# Patient Record
Sex: Male | Born: 1965 | Race: Black or African American | Hispanic: No | Marital: Single | Smoking: Never smoker
Health system: Southern US, Community
[De-identification: ages and names within clinical notes are randomized; demographics above are authoritative.]

## PROBLEM LIST (undated history)

## (undated) DIAGNOSIS — K219 Gastro-esophageal reflux disease without esophagitis: Secondary | ICD-10-CM

---

## 2001-10-29 ENCOUNTER — Emergency Department (HOSPITAL_COMMUNITY): Admission: EM | Admit: 2001-10-29 | Discharge: 2001-10-30 | Payer: Self-pay | Admitting: Emergency Medicine

## 2005-03-14 ENCOUNTER — Emergency Department (HOSPITAL_COMMUNITY): Admission: EM | Admit: 2005-03-14 | Discharge: 2005-03-14 | Payer: Self-pay | Admitting: Family Medicine

## 2012-09-08 ENCOUNTER — Emergency Department (HOSPITAL_COMMUNITY)
Admission: EM | Admit: 2012-09-08 | Discharge: 2012-09-08 | Disposition: A | Discharge status: Home / Self Care | Payer: BC Managed Care – PPO | Attending: Emergency Medicine | Admitting: Emergency Medicine

## 2012-09-08 ENCOUNTER — Encounter (HOSPITAL_COMMUNITY): Payer: Self-pay | Admitting: Emergency Medicine

## 2012-09-08 DIAGNOSIS — Z79899 Other long term (current) drug therapy: Secondary | ICD-10-CM | POA: Insufficient documentation

## 2012-09-08 DIAGNOSIS — K219 Gastro-esophageal reflux disease without esophagitis: Secondary | ICD-10-CM | POA: Insufficient documentation

## 2012-09-08 LAB — COMPREHENSIVE METABOLIC PANEL
ALT: 19 U/L (ref 0–53)
AST: 19 U/L (ref 0–37)
Albumin: 3.6 g/dL (ref 3.5–5.2)
Alkaline Phosphatase: 71 U/L (ref 39–117)
BUN: 16 mg/dL (ref 6–23)
CO2: 29 mEq/L (ref 19–32)
Calcium: 8.6 mg/dL (ref 8.4–10.5)
Chloride: 104 mEq/L (ref 96–112)
Creatinine, Ser: 1.19 mg/dL (ref 0.50–1.35)
GFR calc Af Amer: 84 mL/min — ABNORMAL LOW (ref >90)
GFR calc non Af Amer: 72 mL/min — ABNORMAL LOW (ref >90)
Glucose, Bld: 123 mg/dL — ABNORMAL HIGH (ref 70–99)
Potassium: 3.8 mEq/L (ref 3.5–5.1)
Sodium: 138 mEq/L (ref 135–145)
Total Bilirubin: 0.3 mg/dL (ref 0.3–1.2)
Total Protein: 6.8 g/dL (ref 6.0–8.3)

## 2012-09-08 LAB — CBC WITH DIFFERENTIAL/PLATELET
Basophils Absolute: 0 10*3/uL (ref 0.0–0.1)
Basophils Relative: 0 % (ref 0–1)
Eosinophils Absolute: 0.1 10*3/uL (ref 0.0–0.7)
Eosinophils Relative: 2 % (ref 0–5)
HCT: 42.2 % (ref 39.0–52.0)
Hemoglobin: 14.4 g/dL (ref 13.0–17.0)
Lymphocytes Relative: 19 % (ref 12–46)
Lymphs Abs: 1 10*3/uL (ref 0.7–4.0)
MCH: 31.4 pg (ref 26.0–34.0)
MCHC: 34.1 g/dL (ref 30.0–36.0)
MCV: 91.9 fL (ref 78.0–100.0)
Monocytes Absolute: 0.3 10*3/uL (ref 0.1–1.0)
Monocytes Relative: 6 % (ref 3–12)
Neutro Abs: 3.8 10*3/uL (ref 1.7–7.7)
Neutrophils Relative %: 73 % (ref 43–77)
Platelets: 211 10*3/uL (ref 150–400)
RBC: 4.59 MIL/uL (ref 4.22–5.81)
RDW: 12.9 % (ref 11.5–15.5)
WBC: 5.3 10*3/uL (ref 4.0–10.5)

## 2012-09-08 LAB — URINALYSIS, ROUTINE W REFLEX MICROSCOPIC
Bilirubin Urine: NEGATIVE
Glucose, UA: NEGATIVE mg/dL
Hgb urine dipstick: NEGATIVE
Ketones, ur: NEGATIVE mg/dL
Leukocytes, UA: NEGATIVE
Nitrite: NEGATIVE
Protein, ur: NEGATIVE mg/dL
Specific Gravity, Urine: 1.027 (ref 1.005–1.030)
Urobilinogen, UA: 0.2 mg/dL (ref 0.0–1.0)
pH: 6 (ref 5.0–8.0)

## 2012-09-08 LAB — OCCULT BLOOD, POC DEVICE: Fecal Occult Bld: NEGATIVE

## 2012-09-08 MED ORDER — OMEPRAZOLE 20 MG PO CPDR: 20.0000 mg | DELAYED_RELEASE_CAPSULE | Freq: Every day | ORAL | Status: DC

## 2012-09-08 MED ORDER — SUCRALFATE 1 G PO TABS
1.0000 g | ORAL_TABLET | Freq: Once | ORAL | Status: AC
  Administered 2012-09-08: 1 g via ORAL
  Filled 2012-09-08: qty 1

## 2012-09-08 MED ORDER — ONDANSETRON 4 MG PO TBDP
4.0000 mg | ORAL_TABLET | Freq: Once | ORAL | Status: AC
  Administered 2012-09-08: 4 mg via ORAL
  Filled 2012-09-08: qty 1

## 2012-09-08 MED ORDER — SUCRALFATE 1 G PO TABS: 1.0000 g | ORAL_TABLET | ORAL | Status: DC

## 2012-09-08 MED ORDER — ALUM & MAG HYDROXIDE-SIMETH 200-200-20 MG/5ML PO SUSP
30.0000 mL | Freq: Once | ORAL | Status: AC
  Administered 2012-09-08: 30 mL via ORAL
  Filled 2012-09-08: qty 30

## 2012-09-08 NOTE — ED Notes (Signed)
Pt reports 5/10 generalized abdominal pain that began around 1500. Pt reports similar episode last Sunday, which he reports was slightly relieved by pepto. Pt denies N/V/D. Pt is A/O x4 and in NAD.

## 2012-09-08 NOTE — ED Provider Notes (Signed)
CSN: [id number]     Arrival date & time 09/08/12  4098 History   First MD Initiated Contact with Patient 09/08/12 1915     Chief Complaint  Patient presents with  . Abdominal Pain    HPI Pt reports 5/10 generalized abdominal pain that began around 1500. Pt reports similar episode last Sunday, which he reports was slightly relieved by pepto. Pt denies N/V/D. Pt is A/O x4 and in NAD.  Patient states the pain is decreased but a bit now.  Patient did have some dark stool a few days ago.  Patient does have some history of reflux disease.  Denies chest pain.  Denies NSAID use. Denies alcohol use. History reviewed. No pertinent past medical history. History reviewed. No pertinent past surgical history. No family history on file. History  Substance Use Topics  . Smoking status: Never Smoker   . Smokeless tobacco: Never Used  . Alcohol Use: No    Review of Systems All other systems reviewed and are negative Allergies  Review of patient's allergies indicates no known allergies.  Home Medications   Current Outpatient Rx  Name  Route  Sig  Dispense  Refill  . bismuth subsalicylate (PEPTO BISMOL) 262 MG/15ML suspension   Oral   Take 15 mLs by mouth every 6 (six) hours as needed for indigestion.         Marland Kitchen omeprazole (PRILOSEC) 20 MG capsule   Oral   Take 1 capsule (20 mg total) by mouth daily.   14 capsule   0   . sucralfate (CARAFATE) 1 G tablet   Oral   Take 1 tablet (1 g total) by mouth 1 day or 1 dose.   14 tablet   0    BP 141/79  Pulse 74  Temp(Src) 98.6 F (37 C) (Oral)  Resp 17  SpO2 100% Physical Exam  Nursing note and vitals reviewed. Constitutional: He is oriented to person, place, and time. He appears well-developed and well-nourished. No distress.  HENT:  Head: Normocephalic and atraumatic.  Eyes: Pupils are equal, round, and reactive to light.  Neck: Normal range of motion.  Cardiovascular: Normal rate and intact distal pulses.   Pulmonary/Chest: No  respiratory distress.  Abdominal: Soft. Normal appearance and bowel sounds are normal. He exhibits no distension. There is no tenderness. There is no rebound and no guarding.  Musculoskeletal: Normal range of motion.  Neurological: He is alert and oriented to person, place, and time. No cranial nerve deficit.  Skin: Skin is warm and dry. No rash noted.  Psychiatric: He has a normal mood and affect. His behavior is normal.    ED Course  Procedures (including critical care time) Medications  alum & mag hydroxide-simeth (MAALOX/MYLANTA) 200-200-20 MG/5ML suspension 30 mL (30 mLs Oral Given 09/08/12 2058)  sucralfate (CARAFATE) tablet 1 g (1 g Oral Given 09/08/12 2231)  ondansetron (ZOFRAN-ODT) disintegrating tablet 4 mg (4 mg Oral Given 09/08/12 2230)    Labs Review Labs Reviewed  COMPREHENSIVE METABOLIC PANEL - Abnormal; Notable for the following:    Glucose, Bld 123 (*)    GFR calc non Af Amer 72 (*)    GFR calc Af Amer 84 (*)    All other components within normal limits  CBC WITH DIFFERENTIAL  URINALYSIS, ROUTINE W REFLEX MICROSCOPIC  LIPASE, BLOOD  OCCULT BLOOD, POC DEVICE   Imaging Review No results found.  MDM   1. GERD (gastroesophageal reflux disease)        Nelia Shi, MD  09/10/12 0734 

## 2012-10-26 ENCOUNTER — Ambulatory Visit (INDEPENDENT_AMBULATORY_CARE_PROVIDER_SITE_OTHER): Payer: BC Managed Care – PPO | Admitting: Family Medicine

## 2012-10-26 VITALS — BP 122/70 | HR 90 | Temp 98.0°F | Resp 16 | Ht 76.0 in | Wt 294.0 lb

## 2012-10-26 DIAGNOSIS — K219 Gastro-esophageal reflux disease without esophagitis: Secondary | ICD-10-CM

## 2012-10-26 MED ORDER — OMEPRAZOLE 20 MG PO CPDR: 20.0000 mg | DELAYED_RELEASE_CAPSULE | Freq: Every day | ORAL | Status: DC

## 2012-10-26 NOTE — Patient Instructions (Signed)
Gastroesophageal Reflux Disease, Adult  Gastroesophageal reflux disease (GERD) happens when acid from your stomach flows up into the esophagus. When acid comes in contact with the esophagus, the acid causes soreness (inflammation) in the esophagus. Over time, GERD may create small holes (ulcers) in the lining of the esophagus.  CAUSES   · Increased body weight. This puts pressure on the stomach, making acid rise from the stomach into the esophagus.  · Smoking. This increases acid production in the stomach.  · Drinking alcohol. This causes decreased pressure in the lower esophageal sphincter (valve or ring of muscle between the esophagus and stomach), allowing acid from the stomach into the esophagus.  · Late evening meals and a full stomach. This increases pressure and acid production in the stomach.  · A malformed lower esophageal sphincter.  Sometimes, no cause is found.  SYMPTOMS   · Burning pain in the lower part of the mid-chest behind the breastbone and in the mid-stomach area. This may occur twice a week or more often.  · Trouble swallowing.  · Sore throat.  · Dry cough.  · Asthma-like symptoms including chest tightness, shortness of breath, or wheezing.  DIAGNOSIS   Your caregiver may be able to diagnose GERD based on your symptoms. In some cases, X-rays and other tests may be done to check for complications or to check the condition of your stomach and esophagus.  TREATMENT   Your caregiver may recommend over-the-counter or prescription medicines to help decrease acid production. Ask your caregiver before starting or adding any new medicines.   HOME CARE INSTRUCTIONS   · Change the factors that you can control. Ask your caregiver for guidance concerning weight loss, quitting smoking, and alcohol consumption.  · Avoid foods and drinks that make your symptoms worse, such as:  · Caffeine or alcoholic drinks.  · Chocolate.  · Peppermint or mint flavorings.  · Garlic and onions.  · Spicy foods.  · Citrus fruits,  such as oranges, lemons, or limes.  · Tomato-based foods such as sauce, chili, salsa, and pizza.  · Fried and fatty foods.  · Avoid lying down for the 3 hours prior to your bedtime or prior to taking a nap.  · Eat small, frequent meals instead of large meals.  · Wear loose-fitting clothing. Do not wear anything tight around your waist that causes pressure on your stomach.  · Raise the head of your bed 6 to 8 inches with wood blocks to help you sleep. Extra pillows will not help.  · Only take over-the-counter or prescription medicines for pain, discomfort, or fever as directed by your caregiver.  · Do not take aspirin, ibuprofen, or other nonsteroidal anti-inflammatory drugs (NSAIDs).  SEEK IMMEDIATE MEDICAL CARE IF:   · You have pain in your arms, neck, jaw, teeth, or back.  · Your pain increases or changes in intensity or duration.  · You develop nausea, vomiting, or sweating (diaphoresis).  · You develop shortness of breath, or you faint.  · Your vomit is green, yellow, black, or looks like coffee grounds or blood.  · Your stool is red, bloody, or black.  These symptoms could be signs of other problems, such as heart disease, gastric bleeding, or esophageal bleeding.  MAKE SURE YOU:   · Understand these instructions.  · Will watch your condition.  · Will get help right away if you are not doing well or get worse.  Document Released: 10/05/2004 Document Revised: 03/20/2011 Document Reviewed: 07/15/2010  [employer]® Patient   Information ©2014 [employer].

## 2012-10-26 NOTE — Progress Notes (Signed)
  Subjective:    Patient ID: Logan Calderon, male    DOB: 06/07/1965, 47 y.o.   MRN: [id number]  Abdominal Pain Pertinent negatives include no constipation, diarrhea, fever, frequency, hematuria, nausea or vomiting.   This 47 y.o. male presents for evaluation of upset stomach.  Feeling better now.  Called into work due to upset stomach; requiring office note.  No fever/chill/sweats.  Gassy; +GERD at night; +stomach pains.  No n/v/d/c.  No bloody stools or black stools.  History of issues/upset stomach a few months ago.  ED visit; ED prescribed Prilosec for two weeks.  Ate pizza, soda which is trigger.  Took GasX.  No urinary symptoms.  PCP:  Sans   Review of Systems  Constitutional: Negative for fever, chills, diaphoresis and fatigue.  Gastrointestinal: Positive for abdominal pain. Negative for nausea, vomiting, diarrhea, constipation, blood in stool, abdominal distention and rectal pain.  Genitourinary: Negative for urgency, frequency, hematuria and flank pain.   History reviewed. No pertinent past medical history. History reviewed. No pertinent past surgical history. No Known Allergies Current Outpatient Prescriptions on File Prior to Visit  Medication Sig Dispense Refill  . bismuth subsalicylate (PEPTO BISMOL) 262 MG/15ML suspension Take 15 mLs by mouth every 6 (six) hours as needed for indigestion.      . sucralfate (CARAFATE) 1 G tablet Take 1 tablet (1 g total) by mouth 1 day or 1 dose.  14 tablet  0   No current facility-administered medications on file prior to visit.   Family History  Problem Relation Age of Onset  . Diabetes Father   . Diabetes Sister       Objective:   Physical Exam  Nursing note and vitals reviewed. Constitutional: He is oriented to person, place, and time. He appears well-developed and well-nourished.  HENT:  Head: Normocephalic and atraumatic.  Mouth/Throat: Oropharynx is clear and moist.  Eyes: Conjunctivae are normal. Pupils are equal, round, and  reactive to light.  Neck: Normal range of motion. Neck supple. No thyromegaly present.  Cardiovascular: Normal rate, regular rhythm and normal heart sounds.  Exam reveals no gallop and no friction rub.   No murmur heard. Pulmonary/Chest: Effort normal and breath sounds normal. He has no wheezes. He has no rales.  Abdominal: Soft. Bowel sounds are normal. He exhibits no distension and no mass. There is no tenderness. There is no rebound and no guarding.  Lymphadenopathy:    He has no cervical adenopathy.  Neurological: He is alert and oriented to person, place, and time.  Skin: Skin is warm and dry. No rash noted.  Psychiatric: He has a normal mood and affect. His behavior is normal.       Assessment & Plan:  GERD (gastroesophageal reflux disease) - Plan: omeprazole (PRILOSEC) 20 MG capsule  1. GERD:  New.  Dietary modification advised and reviewed; rx for Omeprazole provided.  Recommend weight loss and avoiding late night eating.  Meds ordered this encounter  Medications  . omeprazole (PRILOSEC) 20 MG capsule    Sig: Take 1 capsule (20 mg total) by mouth daily.    Dispense:  30 capsule    Refill:  2

## 2012-11-30 ENCOUNTER — Emergency Department (HOSPITAL_COMMUNITY)
Admission: EM | Admit: 2012-11-30 | Discharge: 2012-11-30 | Disposition: A | Discharge status: Home / Self Care | Payer: BC Managed Care – PPO | Attending: Emergency Medicine | Admitting: Emergency Medicine

## 2012-11-30 ENCOUNTER — Encounter (HOSPITAL_COMMUNITY): Payer: Self-pay | Admitting: Emergency Medicine

## 2012-11-30 DIAGNOSIS — K2970 Gastritis, unspecified, without bleeding: Secondary | ICD-10-CM | POA: Insufficient documentation

## 2012-11-30 HISTORY — DX: Gastro-esophageal reflux disease without esophagitis: K21.9

## 2012-11-30 LAB — COMPREHENSIVE METABOLIC PANEL
Albumin: 3.9 g/dL (ref 3.5–5.2)
Alkaline Phosphatase: 90 U/L (ref 39–117)
BUN: 18 mg/dL (ref 6–23)
CO2: 25 mEq/L (ref 19–32)
Chloride: 103 mEq/L (ref 96–112)
Creatinine, Ser: 1.27 mg/dL (ref 0.50–1.35)
GFR calc non Af Amer: 67 mL/min — ABNORMAL LOW (ref >90)
Glucose, Bld: 135 mg/dL — ABNORMAL HIGH (ref 70–99)
Potassium: 3.6 mEq/L (ref 3.5–5.1)
Total Bilirubin: 0.3 mg/dL (ref 0.3–1.2)

## 2012-11-30 LAB — LIPASE, BLOOD: Lipase: 51 U/L (ref 11–59)

## 2012-11-30 LAB — CBC
HCT: 43.4 % (ref 39.0–52.0)
Hemoglobin: 14.9 g/dL (ref 13.0–17.0)
MCV: 91.6 fL (ref 78.0–100.0)
RBC: 4.74 MIL/uL (ref 4.22–5.81)
WBC: 8 10*3/uL (ref 4.0–10.5)

## 2012-11-30 MED ORDER — SODIUM CHLORIDE 0.9 % IV BOLUS (SEPSIS)
1000.0000 mL | Freq: Once | INTRAVENOUS | Status: AC
  Administered 2012-11-30: 1000 mL via INTRAVENOUS

## 2012-11-30 MED ORDER — PANTOPRAZOLE SODIUM 40 MG PO TBEC: 40.0000 mg | DELAYED_RELEASE_TABLET | Freq: Every day | ORAL | Status: DC

## 2012-11-30 MED ORDER — HYDROMORPHONE HCL PF 1 MG/ML IJ SOLN
0.5000 mg | Freq: Once | INTRAMUSCULAR | Status: AC
  Administered 2012-11-30: 0.5 mg via INTRAVENOUS
  Filled 2012-11-30: qty 1

## 2012-11-30 MED ORDER — ALUM & MAG HYDROXIDE-SIMETH 200-200-20 MG/5ML PO SUSP
30.0000 mL | Freq: Once | ORAL | Status: DC
  Filled 2012-11-30: qty 30

## 2012-11-30 MED ORDER — ONDANSETRON 4 MG PO TBDP
4.0000 mg | ORAL_TABLET | Freq: Once | ORAL | Status: DC
  Filled 2012-11-30: qty 1

## 2012-11-30 MED ORDER — PROMETHAZINE HCL 25 MG RE SUPP: 25.0000 mg | Freq: Four times a day (QID) | RECTAL | Status: DC | PRN

## 2012-11-30 MED ORDER — PROMETHAZINE HCL 25 MG/ML IJ SOLN
25.0000 mg | Freq: Once | INTRAMUSCULAR | Status: AC
  Administered 2012-11-30: 25 mg via INTRAVENOUS
  Filled 2012-11-30: qty 1

## 2012-11-30 MED ORDER — MORPHINE SULFATE 2 MG/ML IJ SOLN
2.0000 mg | Freq: Once | INTRAMUSCULAR | Status: AC
  Administered 2012-11-30: 2 mg via INTRAVENOUS
  Filled 2012-11-30: qty 1

## 2012-11-30 MED ORDER — GI COCKTAIL ~~LOC~~
30.0000 mL | Freq: Once | ORAL | Status: AC
  Administered 2012-11-30: 30 mL via ORAL
  Filled 2012-11-30: qty 30

## 2012-11-30 MED ORDER — ONDANSETRON 8 MG/NS 50 ML IVPB
8.0000 mg | Freq: Once | INTRAVENOUS | Status: AC
  Administered 2012-11-30: 8 mg via INTRAVENOUS
  Filled 2012-11-30: qty 8

## 2012-11-30 MED ORDER — ONDANSETRON 4 MG PO TBDP: 4.0000 mg | ORAL_TABLET | Freq: Three times a day (TID) | ORAL | Status: DC | PRN

## 2012-11-30 NOTE — ED Notes (Signed)
PA at bedside.

## 2012-11-30 NOTE — ED Notes (Signed)
Patient given gingerale for PO challenge.

## 2012-11-30 NOTE — ED Notes (Signed)
Entered room to assess patient Patient resting with eyes closed, but wakes up to verbal stimuli Patient able to drink half a can of ginger ale without N/V Will inform PA

## 2012-11-30 NOTE — ED Notes (Signed)
C/O  stomach pain. Patient had doritos, [place]un[place] stew and cookies. Patient was here about a couple of months ago for the same thing. Was given medication which resolved the issue last time. Denies diarrhea or coughing up blood. Has been burping, has urge to vomit with burping

## 2012-11-30 NOTE — ED Notes (Signed)
Patient with audible bowel sounds. Large amount of emesis. Lauren the PA made aware. Also made aware of patient's abdominal  Pain.

## 2012-11-30 NOTE — ED Notes (Signed)
Assumed care of patient s/p report VS updated and stable Patient states that he is no longer nauseated, but would like additional pain medication PA made aware  Patient denies further needs at this time Side rails up, call bell in reach

## 2012-11-30 NOTE — ED Notes (Signed)
Pt is aware a urine sample is needed.  Pt states he is currently unable to urinate but does feel nausea.

## 2012-11-30 NOTE — ED Provider Notes (Signed)
CSN: [id number]     Arrival date & time 11/30/12  1621 History   First MD Initiated Contact with Patient 11/30/12 1625     Chief Complaint  Patient presents with  . Abdominal Pain   (Consider location/radiation/quality/duration/timing/severity/associated sxs/prior Treatment) HPI Comments: Patient with a history of GERD presents with generalized abdominal pain and 1 episode of vomiting at 1400.  He reports he is not on any GERD medication at this time.  States it is similar to previous episode 4 months ago.  Last BM was this morning. No urinary symptoms. Afebrile. Denies previous abdominal surgeries.   Patient is a 47 y.o. male presenting with abdominal pain. The history is provided by the patient. No language interpreter was used.  Abdominal Pain Pain location:  Generalized Pain quality: aching and dull   Pain quality: not burning, not cramping and not sharp   Pain radiates to:  Does not radiate Pain severity:  Moderate (7/10) Onset quality:  Gradual Duration:  3 hours Timing:  Constant Progression:  Unchanged Chronicity:  New Context: eating   Context: not alcohol use, not diet changes, not laxative use, not previous surgeries and not sick contacts   Relieved by:  Nothing Exacerbated by: Standing up. Ineffective treatments:  Antacids Associated symptoms: nausea and vomiting   Associated symptoms: no chest pain, no chills, no constipation, no cough, no diarrhea, no dysuria, no fever, no hematemesis, no hematochezia, no hematuria and no shortness of breath   Nausea:    Severity:  Moderate   Onset quality:  Gradual   Duration:  3 hours   Timing:  Constant   Progression:  Unchanged Vomiting:    Quality:  Undigested food   Number of occurrences:  1   Severity:  Mild   Timing:  Intermittent Risk factors: no alcohol abuse, no aspirin use, not elderly and has not had multiple surgeries     Past Medical History  Diagnosis Date  . Gastric reflux    History reviewed. No  pertinent past surgical history. Family History  Problem Relation Age of Onset  . Diabetes Father   . Diabetes Sister   . Kidney failure Sister    History  Substance Use Topics  . Smoking status: Never Smoker   . Smokeless tobacco: Never Used  . Alcohol Use: No    Review of Systems  Constitutional: Negative for fever and chills.  Respiratory: Negative for cough and shortness of breath.   Cardiovascular: Negative for chest pain.  Gastrointestinal: Positive for nausea, vomiting and abdominal pain. Negative for diarrhea, constipation, hematochezia and hematemesis.  Genitourinary: Negative for dysuria and hematuria.    Allergies  Review of patient's allergies indicates no known allergies.  Home Medications   Current Outpatient Rx  Name  Route  Sig  Dispense  Refill  . ondansetron (ZOFRAN ODT) 4 MG disintegrating tablet   Oral   Take 1 tablet (4 mg total) by mouth every 8 (eight) hours as needed for nausea or vomiting.   20 tablet   0   . promethazine (PHENERGAN) 25 MG suppository   Rectal   Place 1 suppository (25 mg total) rectally every 6 (six) hours as needed for nausea or vomiting.   12 each   0    BP 119/67  Pulse 73  Temp(Src) 98.4 F (36.9 C) (Oral)  Resp 20  SpO2 98% Physical Exam  Nursing note and vitals reviewed. Constitutional: He is oriented to person, place, and time. Vital signs are normal. He appears well-developed  and well-nourished. No distress.  HENT:  Head: Normocephalic and atraumatic.  Eyes: EOM are normal. Pupils are equal, round, and reactive to light. No scleral icterus.  Neck: Neck supple.  Cardiovascular: Normal rate, regular rhythm and normal heart sounds.   No murmur heard. Pulmonary/Chest: Effort normal and breath sounds normal. No respiratory distress. He has no wheezes. He has no rales.  Abdominal: Soft. Normal appearance and bowel sounds are normal. There is no tenderness. There is no rigidity, no rebound, no guarding, no CVA  tenderness, no tenderness at McBurney's point and negative Murphy's sign.  Neurological: He is alert and oriented to person, place, and time.  Skin: Skin is warm and dry. No rash noted.  Psychiatric: He has a normal mood and affect.    ED Course  Procedures (including critical care time) Labs Review Labs Reviewed  COMPREHENSIVE METABOLIC PANEL - Abnormal; Notable for the following:    Glucose, Bld 135 (*)    GFR calc non Af Amer 67 (*)    GFR calc Af Amer 77 (*)    All other components within normal limits  CBC  LIPASE, BLOOD   Imaging Review No results found.  EKG Interpretation   None       MDM   1. Gastritis    Patient reports abdominal pain for 2 hours associated with food.  Reports similar to previous episode when he was last evaluated in the ED 4 months ago. PO meds ordered.  Pt actively vomiting in the ED will give zofran IV, IV fluids given. Pain medication ordered. Pt still actively vomiting in the ED, phenergan ordered. pt reports symptom improvement Will PO challenge the patient in the ED. Patient is handling oral hydration in the ED. Denies vomiting since phenergan was administered. Reports mild abdominal "discomfort".  Pt abdomen non-tender to palpation. Afebrile Discussed patient history and condition with Dr. Fayrene Fearing who agrees the patient can be followed up in the out patient setting. Discussed lab results and treatment plan with the patient.  He reports understanding and no other concerns at this time.   Patient is stable for discharge at this time.   Meds given in ED:  Medications  ondansetron (ZOFRAN) 8 mg/NS 50 ml IVPB (8 mg Intravenous Given 11/30/12 1736)  morphine 2 MG/ML injection 2 mg (2 mg Intravenous Given 11/30/12 1812)  promethazine (PHENERGAN) injection 25 mg (25 mg Intravenous Given 11/30/12 1829)  sodium chloride 0.9 % bolus 1,000 mL (0 mLs Intravenous Stopped 11/30/12 2000)  HYDROmorphone (DILAUDID) injection 0.5 mg (0.5 mg Intravenous  Given 11/30/12 1851)  HYDROmorphone (DILAUDID) injection 0.5 mg (0.5 mg Intravenous Given 11/30/12 2001)  sodium chloride 0.9 % bolus 1,000 mL (0 mLs Intravenous Stopped 11/30/12 2151)  HYDROmorphone (DILAUDID) injection 0.5 mg (0.5 mg Intravenous Given 11/30/12 2043)  gi cocktail (Maalox,Lidocaine,Donnatal) (30 mLs Oral Given 11/30/12 2040)    Discharge Medication List as of 11/30/2012 10:12 PM    START taking these medications   Details  ondansetron (ZOFRAN ODT) 4 MG disintegrating tablet Take 1 tablet (4 mg total) by mouth every 8 (eight) hours as needed for nausea or vomiting., Starting 11/30/2012, Until Discontinued, Print    promethazine (PHENERGAN) 25 MG suppository Place 1 suppository (25 mg total) rectally every 6 (six) hours as needed for nausea or vomiting., Starting 11/30/2012, Until Discontinued, Print            Clabe Seal, PA-C 12/01/12 [phone]

## 2012-12-01 ENCOUNTER — Telehealth: Payer: Self-pay

## 2012-12-01 NOTE — Telephone Encounter (Signed)
PATIENT WAS IN [place] FOR A STOMACH VIRUS, WOULD LIKE TO SPEAK TO SOMEONE ABOUT HIS VISIT THERE AND WHAT HE COULD EAT PLEASE CALL HIM AT [phone] (WOULD LIKE DR Katrinka Blazing TO CALL HIM)

## 2012-12-02 ENCOUNTER — Ambulatory Visit (INDEPENDENT_AMBULATORY_CARE_PROVIDER_SITE_OTHER): Payer: BC Managed Care – PPO | Admitting: Emergency Medicine

## 2012-12-02 VITALS — BP 126/90 | HR 81 | Temp 98.8°F | Resp 16 | Ht 76.0 in | Wt 295.0 lb

## 2012-12-02 DIAGNOSIS — R1110 Vomiting, unspecified: Secondary | ICD-10-CM

## 2012-12-02 DIAGNOSIS — R7309 Other abnormal glucose: Secondary | ICD-10-CM

## 2012-12-02 LAB — GLUCOSE, POCT (MANUAL RESULT ENTRY): POC Glucose: 86 mg/dl (ref 70–99)

## 2012-12-02 NOTE — Addendum Note (Signed)
Addended by: Lesle Chris A on: 12/02/2012 02:38 PM   Modules accepted: Orders

## 2012-12-02 NOTE — Progress Notes (Addendum)
  This chart was scribed for Lesle Chris, MD by Joaquin Music, ED Scribe. This patient was seen in room Room/bed 10 and the patient's care was started at 11:11 AM. Subjective:    Patient ID: Logan Calderon, male    DOB: 1965/05/25, 47 y.o.   MRN: [id number] Chief Complaint  Patient presents with  . Follow-up    stomach virus   HPI Logan Calderon is a 47 y.o. male who presents to the [facility] complaining of F/U apt. Pt reports of being seen at [facility] ED 2 days ago. Pt states he was having abd pain and several episodes of emesis. He states labs were done and he was informed that he may have a stomach virus. Pt denies having a meal since 2 days ago. He denies having abd pain today but states he has nausea and fatigue. Pt denies any sick contacts. Pt denies hx of abd complications. Pt has a family hx of DM and states his sister died last year due to DM.  Pt works as a Materials engineer.   Review of Systems  Constitutional: Positive for fatigue. Negative for fever.  Gastrointestinal: Positive for nausea. Negative for vomiting and abdominal pain.  All other systems reviewed and are negative.   Objective:   Physical Exam CONSTITUTIONAL: Well developed/well nourished HEAD: Normocephalic/atraumatic EYES: EOMI/PERRL there is a subconjunctival hemorrhage on the left ENMT: Mucous membranes moist NECK: supple no meningeal signs SPINE:entire spine nontender CV: S1/S2 noted, no murmurs/rubs/gallops noted LUNGS: Lungs are clear to auscultation bilaterally, no apparent distress ABDOMEN: soft, nontender, no rebound or guarding. There are no areas of tenderness noted there is no rebound . GU:no cva tenderness NEURO: Pt is awake/alert, moves all extremitiesx4 EXTREMITIES: pulses normal, full ROM SKIN: warm, color normal PSYCH: no abnormalities of mood noted Results for orders placed in visit on 12/02/12  GLUCOSE, POCT (MANUAL RESULT ENTRY)      Result Value Range   POC Glucose 86   70 - 99 mg/dl  POCT GLYCOSYLATED HEMOGLOBIN (HGB A1C)      Result Value Range   Hemoglobin A1C 5.4      Triage Vitals:BP 126/90  Pulse 81  Temp(Src) 98.8 F (37.1 C) (Oral)  Resp 16  Ht 6\' 4"  (1.93 m)  Wt 295 lb (133.811 kg)  BMI 35.92 kg/m2  SpO2 98% Assessment & Plan:   I suspect the patient has had a gastroenteritis. He is afebrile today. I think we slowly need to advance his diet leave him out of work today and tomorrow see how he does. I personally performed the services described in this documentation, which was scribed in my presence. The recorded information has been reviewed and is accurate.

## 2012-12-02 NOTE — Patient Instructions (Signed)

## 2012-12-03 ENCOUNTER — Telehealth: Payer: Self-pay

## 2012-12-03 DIAGNOSIS — G8929 Other chronic pain: Secondary | ICD-10-CM

## 2012-12-03 NOTE — Telephone Encounter (Signed)
Evaluated by Dr. Cleta Alberts at [facility] on 12/02/12; discussed appropriate food intake and followed up on recent ED visit.  No further action warranted at this time.

## 2012-12-03 NOTE — Telephone Encounter (Signed)
Dr Cleta Alberts wanted to see how he is feeling, I have called again. Patient states he is still weak but is better. Encouraged him to push fluids and rest. He agrees, he works loading/ unloading wine and beer. He states he does not feel strong enough to return to work this week, provided work note for him. He has not yet been able to eat. Encouraged him to push fluids and gradually add in food as he feels better, he agrees and will return if he does not improve, or if he gets worse.

## 2012-12-03 NOTE — Telephone Encounter (Signed)
No, he was not having fever or pain, he has had continued weakness and nausea. Note provided.

## 2012-12-03 NOTE — Telephone Encounter (Signed)
Please check but I think I put in a referral for GI to evaluate him because he's been having some chronic GI problems

## 2012-12-03 NOTE — Telephone Encounter (Signed)
PT STATES DR DAUB CALLED YESTERDAY AND TOLD HIM HE HAD SOME INFORMATION TO GIVE HIM. PLEASE CALL [phone]

## 2012-12-03 NOTE — Telephone Encounter (Signed)
Please check back in just be sure he is not running fever and not having abdominal pain only the fatigue and weakness. If he is having any other symptoms she does need to return for CBC and exam. Otherwise we can give him a note for the rest of the week.

## 2012-12-04 NOTE — Telephone Encounter (Signed)
Referral made. Called patient left message for him to call me.

## 2012-12-04 NOTE — ED Provider Notes (Signed)
Medical screening examination/treatment/procedure(s) were performed by non-physician practitioner and as supervising physician I was immediately available for consultation/collaboration.  EKG Interpretation   None         Ovella Manygoats J Yolunda Kloos, MD 12/04/12 0728 

## 2012-12-04 NOTE — Telephone Encounter (Signed)
Patient called back, he indicates he is better now. He will hold off if he is well.

## 2013-01-23 ENCOUNTER — Encounter: Payer: Self-pay | Admitting: Emergency Medicine

## 2013-12-30 ENCOUNTER — Ambulatory Visit (INDEPENDENT_AMBULATORY_CARE_PROVIDER_SITE_OTHER): Payer: BC Managed Care – PPO | Admitting: Physician Assistant

## 2013-12-30 VITALS — BP 126/76 | HR 78 | Temp 98.2°F | Resp 18 | Ht 75.0 in | Wt 304.2 lb

## 2013-12-30 DIAGNOSIS — M62830 Muscle spasm of back: Secondary | ICD-10-CM

## 2013-12-30 DIAGNOSIS — M545 Low back pain, unspecified: Secondary | ICD-10-CM

## 2013-12-30 MED ORDER — CYCLOBENZAPRINE HCL 10 MG PO TABS: 10.0000 mg | ORAL_TABLET | Freq: Three times a day (TID) | ORAL | Status: DC | PRN

## 2013-12-30 NOTE — Progress Notes (Signed)
Subjective:    Patient ID: Logan Calderon, male    DOB: 11-18-1965, 48 y.o.   MRN: [id number]  PCP: No primary care provider on file.  Chief Complaint  Patient presents with  . Back Pain    since sunday--mid-lower back pain   There are no active problems to display for this patient.  Prior to Admission medications   Medication Sig Start Date End Date Taking? Authorizing Provider  cyclobenzaprine (FLEXERIL) 10 MG tablet Take 1 tablet (10 mg total) by mouth 3 (three) times daily as needed for muscle spasms. 12/30/13   Raelyn Ensignodd Dashay Giesler, PA   Medications, allergies, past medical history, surgical history, family history, social history and problem list reviewed and updated.  HPI  [age] yom with no significant pmh presents today for low back pain that started 2 days ago.  He was at home 2 days ago and bent down to pick something up. He had pain along his lumbar spine and just to the right as well. The pain was mild but persisted throughout the day Sunday. Not much relief with aleve. He awoke yest to go to work and the pain was still there but slightly worse. 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 He attempted to go to work this am though the pain was still present. His employer told him to go be seen at a clinic prior to starting work. He presented here.   This has happened to him several times prior. He often has to get in not ideal positioning to lift cases due to the layout in the truck.   He denies any sciatica, bowel/bladder dysfx, perianal los.   Review of Systems No fever, chills.     Objective:   Physical Exam  Constitutional: He is oriented to person, place, and time. He appears well-developed and well-nourished.  Non-toxic appearance. He does not have a sickly appearance. He does not appear ill. No distress.  BP 126/76 mmHg  Pulse 78  Temp(Src) 98.2 F (36.8 C) (Oral)  Resp 18  Ht 6\' 3"  (1.905 m)  Wt  304 lb 3.2 oz (137.984 kg)  BMI 38.02 kg/m2  SpO2 98%   Musculoskeletal:       Lumbar back: He exhibits tenderness, bony tenderness and spasm. He exhibits normal range of motion.       Back:  TTP over lumbar spine and right paraspinals. Small muscle spasm palpated right paraspinals in lumbar area. Normal sensation LE. Normal strength LE bilaterally.   Neurological: He is alert and oriented to person, place, and time.  Psychiatric: He has a normal mood and affect. His speech is normal.      Assessment & Plan:   [age] yom with no significant pmh presents today for low back pain that started 2 days ago.  Muscle spasm of back - Plan: cyclobenzaprine (FLEXERIL) 10 MG tablet Right-sided low back pain without sciatica - Plan: cyclobenzaprine (FLEXERIL) 10 MG tablet --low back pain likely due to strain/spasm --flexeril prn --instructions given to NOT drive if taking flexeril, encouraged to take at night only if working but can take tid as needed over the holiday while not working --Ryder Systemaleve prn --heat/ice --low back stretches/exercises given, encouraged 1-2 times daily --encouraged proper posture while working --rtc if not improving in few days  Donnajean Lopesodd M. Adiana Smelcer, PA-C Physician Assistant-Certified [facility]  12/30/2013 10:09 AM

## 2013-12-30 NOTE — Patient Instructions (Signed)
You have a small muscle spasm along your lower spine on the right side.  Please take the flexeril every 8 hours as needed for the spasm. Remember this medication will make you sleepy so just take it at night if you're working because you DO NOT want to drive on this medication. On you days off you can take it up to every 8 hours as needed. Continue with the aleve every 4-6 hours.  You should rotate between both heat and ice to the area.  Be sure to use great posture while working to prevent this happening again.  I've attached some exercises for you to do once-twice daily as you can tolerate.  Return to clinic if you're not feeling better in a few days.   Low Back Strain with Rehab A strain is an injury in which a tendon or muscle is torn. The muscles and tendons of the lower back are vulnerable to strains. However, these muscles and tendons are very strong and require a great force to be injured. Strains are classified into three categories. Grade 1 strains cause pain, but the tendon is not lengthened. Grade 2 strains include a lengthened ligament, due to the ligament being stretched or partially ruptured. With grade 2 strains there is still function, although the function may be decreased. Grade 3 strains involve a complete tear of the tendon or muscle, and function is usually impaired. SYMPTOMS   Pain in the lower back.  Pain that affects one side more than the other.  Pain that gets worse with movement and may be felt in the hip, buttocks, or back of the thigh.  Muscle spasms of the muscles in the back.  Swelling along the muscles of the back.  Loss of strength of the back muscles.  Crackling sound (crepitation) when the muscles are touched. CAUSES  Lower back strains occur when a force is placed on the muscles or tendons that is greater than they can handle. Common causes of injury include:  Prolonged overuse of the muscle-tendon units in the lower back, usually from incorrect  posture.  A single violent injury or force applied to the back. RISK INCREASES WITH:  Sports that involve twisting forces on the spine or a lot of bending at the waist (football, rugby, weightlifting, bowling, golf, tennis, speed skating, racquetball, swimming, running, gymnastics, diving).  Poor strength and flexibility.  Failure to warm up properly before activity.  Family history of lower back pain or disk disorders.  Previous back injury or surgery (especially fusion).  Poor posture with lifting, especially heavy objects.  Prolonged sitting, especially with poor posture. PREVENTION   Learn and use proper posture when sitting or lifting (maintain proper posture when sitting, lift using the knees and legs, not at the waist).  Warm up and stretch properly before activity.  Allow for adequate recovery between workouts.  Maintain physical fitness:  Strength, flexibility, and endurance.  Cardiovascular fitness. PROGNOSIS  If treated properly, lower back strains usually heal within 6 weeks. RELATED COMPLICATIONS   Recurring symptoms, resulting in a chronic problem.  Chronic inflammation, scarring, and partial muscle-tendon tear.  Delayed healing or resolution of symptoms.  Prolonged disability. TREATMENT  Treatment first involves the use of ice and medicine, to reduce pain and inflammation. The use of strengthening and stretching exercises may help reduce pain with activity. These exercises may be performed at home or with a therapist. Severe injuries may require referral to a therapist for further evaluation and treatment, such as ultrasound. Your  caregiver may advise that you wear a back brace or corset, to help reduce pain and discomfort. Often, prolonged bed rest results in greater harm then benefit. Corticosteroid injections may be recommended. However, these should be reserved for the most serious cases. It is important to avoid using your back when lifting objects. At  night, sleep on your back on a firm mattress with a pillow placed under your knees. If non-surgical treatment is unsuccessful, surgery may be needed.  MEDICATION   If pain medicine is needed, nonsteroidal anti-inflammatory medicines (aspirin and ibuprofen), or other minor pain relievers (acetaminophen), are often advised.  Do not take pain medicine for 7 days before surgery.  Prescription pain relievers may be given, if your caregiver thinks they are needed. Use only as directed and only as much as you need.  Ointments applied to the skin may be helpful.  Corticosteroid injections may be given by your caregiver. These injections should be reserved for the most serious cases, because they may only be given a certain number of times. HEAT AND COLD  Cold treatment (icing) should be applied for 10 to 15 minutes every 2 to 3 hours for inflammation and pain, and immediately after activity that aggravates your symptoms. Use ice packs or an ice massage.  Heat treatment may be used before performing stretching and strengthening activities prescribed by your caregiver, physical therapist, or athletic trainer. Use a heat pack or a warm water soak. SEEK MEDICAL CARE IF:   Symptoms get worse or do not improve in 2 to 4 weeks, despite treatment.  You develop numbness, weakness, or loss of bowel or bladder function.  New, unexplained symptoms develop. (Drugs used in treatment may produce side effects.) EXERCISES  RANGE OF MOTION (ROM) AND STRETCHING EXERCISES - Low Back Strain Most people with lower back pain will find that their symptoms get worse with excessive bending forward (flexion) or arching at the lower back (extension). The exercises which will help resolve your symptoms will focus on the opposite motion.  Your physician, physical therapist or athletic trainer will help you determine which exercises will be most helpful to resolve your lower back pain. Do not complete any exercises without  first consulting with your caregiver. Discontinue any exercises which make your symptoms worse until you speak to your caregiver.  If you have pain, numbness or tingling which travels down into your buttocks, leg or foot, the goal of the therapy is for these symptoms to move closer to your back and eventually resolve. Sometimes, these leg symptoms will get better, but your lower back pain may worsen. This is typically an indication of progress in your rehabilitation. Be very alert to any changes in your symptoms and the activities in which you participated in the 24 hours prior to the change. Sharing this information with your caregiver will allow him/her to most efficiently treat your condition.  These exercises may help you when beginning to rehabilitate your injury. Your symptoms may resolve with or without further involvement from your physician, physical therapist or athletic trainer. While completing these exercises, remember:  Restoring tissue flexibility helps normal motion to return to the joints. This allows healthier, less painful movement and activity.  An effective stretch should be held for at least 30 seconds.  A stretch should never be painful. You should only feel a gentle lengthening or release in the stretched tissue. FLEXION RANGE OF MOTION AND STRETCHING EXERCISES: STRETCH - Flexion, Single Knee to Chest   Lie on a firm bed  or floor with both legs extended in front of you.  Keeping one leg in contact with the floor, bring your opposite knee to your chest. Hold your leg in place by either grabbing behind your thigh or at your knee.  Pull until you feel a gentle stretch in your lower back. Hold __________ seconds.  Slowly release your grasp and repeat the exercise with the opposite side. Repeat __________ times. Complete this exercise __________ times per day.  STRETCH - Flexion, Double Knee to Chest   Lie on a firm bed or floor with both legs extended in front of  you.  Keeping one leg in contact with the floor, bring your opposite knee to your chest.  Tense your stomach muscles to support your back and then lift your other knee to your chest. Hold your legs in place by either grabbing behind your thighs or at your knees.  Pull both knees toward your chest until you feel a gentle stretch in your lower back. Hold __________ seconds.  Tense your stomach muscles and slowly return one leg at a time to the floor. Repeat __________ times. Complete this exercise __________ times per day.  STRETCH - Low Trunk Rotation  Lie on a firm bed or floor. Keeping your legs in front of you, bend your knees so they are both pointed toward the ceiling and your feet are flat on the floor.  Extend your arms out to the side. This will stabilize your upper body by keeping your shoulders in contact with the floor.  Gently and slowly drop both knees together to one side until you feel a gentle stretch in your lower back. Hold for __________ seconds.  Tense your stomach muscles to support your lower back as you bring your knees back to the starting position. Repeat the exercise to the other side. Repeat __________ times. Complete this exercise __________ times per day  EXTENSION RANGE OF MOTION AND FLEXIBILITY EXERCISES: STRETCH - Extension, Prone on Elbows   Lie on your stomach on the floor, a bed will be too soft. Place your palms about shoulder width apart and at the height of your head.  Place your elbows under your shoulders. If this is too painful, stack pillows under your chest.  Allow your body to relax so that your hips drop lower and make contact more completely with the floor.  Hold this position for __________ seconds.  Slowly return to lying flat on the floor. Repeat __________ times. Complete this exercise __________ times per day.  RANGE OF MOTION - Extension, Prone Press Ups  Lie on your stomach on the floor, a bed will be too soft. Place your palms  about shoulder width apart and at the height of your head.  Keeping your back as relaxed as possible, slowly straighten your elbows while keeping your hips on the floor. You may adjust the placement of your hands to maximize your comfort. As you gain motion, your hands will come more underneath your shoulders.  Hold this position __________ seconds.  Slowly return to lying flat on the floor. Repeat __________ times. Complete this exercise __________ times per day.  RANGE OF MOTION- Quadruped, Neutral Spine   Assume a hands and knees position on a firm surface. Keep your hands under your shoulders and your knees under your hips. You may place padding under your knees for comfort.  Drop your head and point your tail bone toward the ground below you. This will round out your lower back like an  angry cat. Hold this position for __________ seconds.  Slowly lift your head and release your tail bone so that your back sags into a large arch, like an old horse.  Hold this position for __________ seconds.  Repeat this until you feel limber in your lower back.  Now, find your "sweet spot." This will be the most comfortable position somewhere between the two previous positions. This is your neutral spine. Once you have found this position, tense your stomach muscles to support your lower back.  Hold this position for __________ seconds. Repeat __________ times. Complete this exercise __________ times per day.  STRENGTHENING EXERCISES - Low Back Strain These exercises may help you when beginning to rehabilitate your injury. These exercises should be done near your "sweet spot." This is the neutral, low-back arch, somewhere between fully rounded and fully arched, that is your least painful position. When performed in this safe range of motion, these exercises can be used for people who have either a flexion or extension based injury. These exercises may resolve your symptoms with or without further  involvement from your physician, physical therapist or athletic trainer. While completing these exercises, remember:   Muscles can gain both the endurance and the strength needed for everyday activities through controlled exercises.  Complete these exercises as instructed by your physician, physical therapist or athletic trainer. Increase the resistance and repetitions only as guided.  You may experience muscle soreness or fatigue, but the pain or discomfort you are trying to eliminate should never worsen during these exercises. If this pain does worsen, stop and make certain you are following the directions exactly. If the pain is still present after adjustments, discontinue the exercise until you can discuss the trouble with your caregiver. STRENGTHENING - Deep Abdominals, Pelvic Tilt  Lie on a firm bed or floor. Keeping your legs in front of you, bend your knees so they are both pointed toward the ceiling and your feet are flat on the floor.  Tense your lower abdominal muscles to press your lower back into the floor. This motion will rotate your pelvis so that your tail bone is scooping upwards rather than pointing at your feet or into the floor.  With a gentle tension and even breathing, hold this position for __________ seconds. Repeat __________ times. Complete this exercise __________ times per day.  STRENGTHENING - Abdominals, Crunches   Lie on a firm bed or floor. Keeping your legs in front of you, bend your knees so they are both pointed toward the ceiling and your feet are flat on the floor. Cross your arms over your chest.  Slightly tip your chin down without bending your neck.  Tense your abdominals and slowly lift your trunk high enough to just clear your shoulder blades. Lifting higher can put excessive stress on the lower back and does not further strengthen your abdominal muscles.  Control your return to the starting position. Repeat __________ times. Complete this exercise  __________ times per day.  STRENGTHENING - Quadruped, Opposite UE/LE Lift   Assume a hands and knees position on a firm surface. Keep your hands under your shoulders and your knees under your hips. You may place padding under your knees for comfort.  Find your neutral spine and gently tense your abdominal muscles so that you can maintain this position. Your shoulders and hips should form a rectangle that is parallel with the floor and is not twisted.  Keeping your trunk steady, lift your right hand no higher than your  shoulder and then your left leg no higher than your hip. Make sure you are not holding your breath. Hold this position __________ seconds.  Continuing to keep your abdominal muscles tense and your back steady, slowly return to your starting position. Repeat with the opposite arm and leg. Repeat __________ times. Complete this exercise __________ times per day.  STRENGTHENING - Lower Abdominals, Double Knee Lift  Lie on a firm bed or floor. Keeping your legs in front of you, bend your knees so they are both pointed toward the ceiling and your feet are flat on the floor.  Tense your abdominal muscles to brace your lower back and slowly lift both of your knees until they come over your hips. Be certain not to hold your breath.  Hold __________ seconds. Using your abdominal muscles, return to the starting position in a slow and controlled manner. Repeat __________ times. Complete this exercise __________ times per day.  POSTURE AND BODY MECHANICS CONSIDERATIONS - Low Back Strain Keeping correct posture when sitting, standing or completing your activities will reduce the stress put on different body tissues, allowing injured tissues a chance to heal and limiting painful experiences. The following are general guidelines for improved posture. Your physician or physical therapist will provide you with any instructions specific to your needs. While reading these guidelines, remember:  The  exercises prescribed by your provider will help you have the flexibility and strength to maintain correct postures.  The correct posture provides the best environment for your joints to work. All of your joints have less wear and tear when properly supported by a spine with good posture. This means you will experience a healthier, less painful body.  Correct posture must be practiced with all of your activities, especially prolonged sitting and standing. Correct posture is as important when doing repetitive low-stress activities (typing) as it is when doing a single heavy-load activity (lifting). RESTING POSITIONS Consider which positions are most painful for you when choosing a resting position. If you have pain with flexion-based activities (sitting, bending, stooping, squatting), choose a position that allows you to rest in a less flexed posture. You would want to avoid curling into a fetal position on your side. If your pain worsens with extension-based activities (prolonged standing, working overhead), avoid resting in an extended position such as sleeping on your stomach. Most people will find more comfort when they rest with their spine in a more neutral position, neither too rounded nor too arched. Lying on a non-sagging bed on your side with a pillow between your knees, or on your back with a pillow under your knees will often provide some relief. Keep in mind, being in any one position for a prolonged period of time, no matter how correct your posture, can still lead to stiffness. PROPER SITTING POSTURE In order to minimize stress and discomfort on your spine, you must sit with correct posture. Sitting with good posture should be effortless for a healthy body. Returning to good posture is a gradual process. Many people can work toward this most comfortably by using various supports until they have the flexibility and strength to maintain this posture on their own. When sitting with proper posture,  your ears will fall over your shoulders and your shoulders will fall over your hips. You should use the back of the chair to support your upper back. Your lower back will be in a neutral position, just slightly arched. You may place a small pillow or folded towel at the base  of your lower back for support.  When working at a desk, create an environment that supports good, upright posture. Without extra support, muscles tire, which leads to excessive strain on joints and other tissues. Keep these recommendations in mind: CHAIR:  A chair should be able to slide under your desk when your back makes contact with the back of the chair. This allows you to work closely.  The chair's height should allow your eyes to be level with the upper part of your monitor and your hands to be slightly lower than your elbows. BODY POSITION  Your feet should make contact with the floor. If this is not possible, use a foot rest.  Keep your ears over your shoulders. This will reduce stress on your neck and lower back. INCORRECT SITTING POSTURES  If you are feeling tired and unable to assume a healthy sitting posture, do not slouch or slump. This puts excessive strain on your back tissues, causing more damage and pain. Healthier options include:  Using more support, like a lumbar pillow.  Switching tasks to something that requires you to be upright or walking.  Talking a brief walk.  Lying down to rest in a neutral-spine position. PROLONGED STANDING WHILE SLIGHTLY LEANING FORWARD  When completing a task that requires you to lean forward while standing in one place for a long time, place either foot up on a stationary 2-4 inch high object to help maintain the best posture. When both feet are on the ground, the lower back tends to lose its slight inward curve. If this curve flattens (or becomes too large), then the back and your other joints will experience too much stress, tire more quickly, and can cause  pain. CORRECT STANDING POSTURES Proper standing posture should be assumed with all daily activities, even if they only take a few moments, like when brushing your teeth. As in sitting, your ears should fall over your shoulders and your shoulders should fall over your hips. You should keep a slight tension in your abdominal muscles to brace your spine. Your tailbone should point down to the ground, not behind your body, resulting in an over-extended swayback posture.  INCORRECT STANDING POSTURES  Common incorrect standing postures include a forward head, locked knees and/or an excessive swayback. WALKING Walk with an upright posture. Your ears, shoulders and hips should all line-up. PROLONGED ACTIVITY IN A FLEXED POSITION When completing a task that requires you to bend forward at your waist or lean over a low surface, try to find a way to stabilize 3 out of 4 of your limbs. You can place a hand or elbow on your thigh or rest a knee on the surface you are reaching across. This will provide you more stability so that your muscles do not fatigue as quickly. By keeping your knees relaxed, or slightly bent, you will also reduce stress across your lower back. CORRECT LIFTING TECHNIQUES DO :   Assume a wide stance. This will provide you more stability and the opportunity to get as close as possible to the object which you are lifting.  Tense your abdominals to brace your spine. Bend at the knees and hips. Keeping your back locked in a neutral-spine position, lift using your leg muscles. Lift with your legs, keeping your back straight.  Test the weight of unknown objects before attempting to lift them.  Try to keep your elbows locked down at your sides in order get the best strength from your shoulders when carrying an object.  Always ask for help when lifting heavy or awkward objects. INCORRECT LIFTING TECHNIQUES DO NOT:   Lock your knees when lifting, even if it is a small object.  Bend and  twist. Pivot at your feet or move your feet when needing to change directions.  Assume that you can safely pick up even a paper clip without proper posture. Document Released: 12/26/2004 Document Revised: 03/20/2011 Document Reviewed: 04/09/2008 [facility] Patient Information 2015 [place], [place]. This information is not intended to replace advice given to you by your health care provider. Make sure you discuss any questions you have with your health care provider.

## 2014-03-09 ENCOUNTER — Ambulatory Visit (INDEPENDENT_AMBULATORY_CARE_PROVIDER_SITE_OTHER): Payer: BLUE CROSS/BLUE SHIELD | Admitting: Physician Assistant

## 2014-03-09 ENCOUNTER — Telehealth: Payer: Self-pay

## 2014-03-09 ENCOUNTER — Ambulatory Visit (INDEPENDENT_AMBULATORY_CARE_PROVIDER_SITE_OTHER): Payer: BLUE CROSS/BLUE SHIELD

## 2014-03-09 VITALS — BP 120/88 | HR 78 | Temp 98.4°F | Resp 18 | Ht 76.0 in | Wt 306.0 lb

## 2014-03-09 DIAGNOSIS — M542 Cervicalgia: Secondary | ICD-10-CM

## 2014-03-09 DIAGNOSIS — Z23 Encounter for immunization: Secondary | ICD-10-CM

## 2014-03-09 DIAGNOSIS — Z114 Encounter for screening for human immunodeficiency virus [HIV]: Secondary | ICD-10-CM

## 2014-03-09 DIAGNOSIS — Z6837 Body mass index (BMI) 37.0-37.9, adult: Secondary | ICD-10-CM

## 2014-03-09 MED ORDER — CYCLOBENZAPRINE HCL 10 MG PO TABS: 10.0000 mg | ORAL_TABLET | Freq: Three times a day (TID) | ORAL | Status: DC | PRN

## 2014-03-09 NOTE — Telephone Encounter (Signed)
Chelle,  You saw this patient today. I am directly sending it to you without calling to see what information they are requesting since they are his employers. Wasn't sure if I should make that call.

## 2014-03-09 NOTE — Patient Instructions (Signed)
Take the anti-inflammatory (naproxen) as you are. Apply a warm compress. Use the cyclobenzaprine every 8 hours as needed. Remember that it will cause drowsiness.

## 2014-03-09 NOTE — Telephone Encounter (Signed)
Pt's employer calling with questions about [employer]'s return to work form. Please call either Thayer OhmChris or Arlys JohnBrian at [phone]

## 2014-03-09 NOTE — Telephone Encounter (Signed)
Left detailed message regarding the intent of my letter.  The patient will RTC on Wednesday 3/02 for re-evaluation. I anticipate he will be able to RTW at that time (though not for the start of his shift at 6 am).

## 2014-03-09 NOTE — Progress Notes (Signed)
Subjective:   Patient ID: Logan Calderon, male     DOB: 1965-05-29, 49 y.o.    MRN: [id number]  PCP: No PCP Per Patient  Chief Complaint  Patient presents with  . Neck Pain    X this morning    Outpatient Prescriptions Prior to Visit  Medication Sig Dispense Refill  . cyclobenzaprine (FLEXERIL) 10 MG tablet Take 1 tablet (10 mg total) by mouth 3 (three) times daily as needed for muscle spasms. (Patient not taking: Reported on 03/09/2014) 20 tablet 0   No facility-administered medications prior to visit.    No Known Allergies  Patient Active Problem List   Diagnosis Date Noted  . BMI 37.0-37.9, adult 03/09/2014    Family History  Problem Relation Age of Onset  . Diabetes Sister   . Kidney failure Sister   . Diabetes Mother     History   Social History  . Marital Status: Single    Spouse Name: n/a  . Number of Children: 0  . Years of Education: 12th grade   Occupational History  . Delivery Driver    Social History Main Topics  . Smoking status: Never Smoker   . Smokeless tobacco: Never Used  . Alcohol Use: No  . Drug Use: No  . Sexual Activity: Not on file   Other Topics Concern  . Not on file   Social History Narrative   Lives alone. Parents live in [place], [place]     HPI  Presents for evaluation of RIGHT sided neck pain that developed while sitting at work waiting for a meeting to start. He noticed that rotating his head to the RIGHT caused soreness. After the meeting, he went to a local drug store and the pharmacist recommended OTC naproxen. He took two gel caps, and went on to work (he is a [employer] fast streamer). He then developed a sharp pain shooting in the neck so has presented for evaluation. Worse with rotation.  Several months ago, he had some soreness on the RIGHT side of the neck while sitting on the couch. That soreness is intermittent, and occurs with sitting on the couch and resolves with getting up and moving around.  No radicular pain. No  paresthesias. No weakness.  Review of Systems Review of Systems  Constitutional: Negative for fever and chills.  Cardiovascular: Negative for chest pain and palpitations.  Musculoskeletal: Positive for myalgias and neck pain. Negative for back pain and joint pain.  Skin: Negative for rash.  Neurological: Negative for dizziness, tingling, tremors, sensory change, focal weakness and weakness.        Objective:  Physical Exam Physical Exam  Constitutional: He is oriented to person, place, and time. Vital signs are normal. He appears well-developed and well-nourished. He is active and cooperative. No distress.  BP 120/88 mmHg  Pulse 78  Temp(Src) 98.4 F (36.9 C) (Oral)  Resp 18  Ht  (1.93 m)  Wt 306 lb (138.801 kg)  BMI 37.26 kg/m2  SpO2 97%  HENT:  Head: Normocephalic and atraumatic.  Right Ear: Hearing normal.  Left Ear: Hearing normal.  Eyes: Conjunctivae are normal. No scleral icterus.  Neck: Normal range of motion. Neck supple. No thyromegaly present.  Cardiovascular: Normal rate, regular rhythm and normal heart sounds.   Pulses:      Radial pulses are 2+ on the right side, and 2+ on the left side.  Pulmonary/Chest: Effort normal and breath sounds normal.  Musculoskeletal:       Right shoulder:  Normal.       Right elbow: Normal.      Right wrist: Normal.       Cervical back: He exhibits tenderness and pain. He exhibits normal range of motion, no bony tenderness, no swelling, no edema, no deformity, no laceration, no spasm and normal pulse.       Back:       Right upper arm: Normal.       Right forearm: Normal.       Right hand: Normal. Normal sensation noted. Normal strength noted.  Lymphadenopathy:       Head (right side): No tonsillar, no preauricular, no posterior auricular and no occipital adenopathy present.       Head (left side): No tonsillar, no preauricular, no posterior auricular and no occipital adenopathy present.    He has no cervical adenopathy.         Right: No supraclavicular adenopathy present.       Left: No supraclavicular adenopathy present.  Neurological: He is alert and oriented to person, place, and time. No sensory deficit.  Skin: Skin is warm, dry and intact. No rash noted. No cyanosis or erythema. Nails show no clubbing.  Psychiatric: He has a normal mood and affect. His speech is normal and behavior is normal.   C-Spine: [facility] reading (PRIMARY) by  Dr. Cleta Albertsaub. 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 Question some bony irregularity at that level that could represent foraminal encroachment.      Assessment & Plan:   1. Neck pain Likely mild strain on chronic degenerative disease. Continue OTC NSAID. Add cyclobenzaprine. Heat. Out of work today and tomorrow. RTC 3/02 for re-evaluation, and anticipate return to full duty at that time. If persists, consider PT and/or additional imaging. - DG Cervical Spine Complete; Future - cyclobenzaprine (FLEXERIL) 10 MG tablet; Take 1 tablet (10 mg total) by mouth 3 (three) times daily as needed for muscle spasms.  Dispense: 30 tablet; Refill: 0  2. Need for influenza vaccination - Flu Vaccine QUAD 36+ mos IM  3. BMI 37.0-37.9, adult Healthy eating, regular exercise.  4. Need for Tdap vaccination - Tdap vaccine greater than or equal to 7yo IM  5. Screening for HIV (human immunodeficiency virus) - HIV antibody   Fernande Brashelle S. Lashae Wollenberg, PA-C Physician Assistant-Certified [facility]

## 2014-03-10 LAB — HIV ANTIBODY (ROUTINE TESTING W REFLEX): HIV 1&2 Ab, 4th Generation: NONREACTIVE

## 2014-03-11 ENCOUNTER — Telehealth: Payer: Self-pay

## 2014-03-11 ENCOUNTER — Ambulatory Visit (INDEPENDENT_AMBULATORY_CARE_PROVIDER_SITE_OTHER): Payer: BLUE CROSS/BLUE SHIELD | Admitting: Physician Assistant

## 2014-03-11 VITALS — BP 130/70 | HR 85 | Temp 98.1°F | Resp 16 | Ht 77.0 in | Wt 309.0 lb

## 2014-03-11 DIAGNOSIS — M542 Cervicalgia: Secondary | ICD-10-CM

## 2014-03-11 NOTE — Telephone Encounter (Signed)
Employer is confused by the letter of OOW written.  Please call patients operation manger [phone]- [phone]  Larose KellsBryan Young  To explain patient is coming back on Friday and return to work on Monday.    Chelle

## 2014-03-11 NOTE — Patient Instructions (Signed)
Continue the medications as before (the anti-inflammatory and the muscle relaxer).  Use a warm compress/heating pad on the area for 15-20 minutes 2-3 times each day. After that, do some gentle movement of your neck (the same motions I tested in the office).

## 2014-03-11 NOTE — Progress Notes (Signed)
   Patient ID: Logan Calderon, male    DOB: [birth year], 49 y.o.   MRN: [id number]  PCP: No PCP Per Patient  Subjective:   Chief Complaint  Patient presents with  . Follow-up  . Neck Pain    HPI Presents for re-evaluation and RTW assessment. He was seen on Monday 2/29 for evaluation of right sided neck pain. Xrays revealed mild degenerative changes most prominent at C4-5. He was treated for a presumed neck strain with OTC NSAIDS and prescription Muscle relaxer.  Some improvement, but still sore with movement. If moves suddenly, descending stairs, any jostling. No more of the sharp pains. Feels like it's not better enough to resume his regular duty, which requires regular ROM of the neck, bending, stooping, lifting and driving.  No numbness, tingling or radicular pain.  Review of Systems Review of Systems  As above.   Patient Active Problem List   Diagnosis Date Noted  . BMI 37.0-37.9, adult 03/09/2014    No Known Allergies  Prior to Admission medications   Medication Sig Start Date End Date Taking? Authorizing Provider  Naproxen Sodium 220 MG CAPS Take 220-440 mg by mouth 2 (two) times daily.   Yes Historical Provider, MD  cyclobenzaprine (FLEXERIL) 10 MG tablet Take 1 tablet (10 mg total) by mouth 3 (three) times daily as needed for muscle spasms. 03/09/14   Fernande Brashelle S Jalonda Antigua, PA-C    Past Medical, Surgical Family and Social History reviewed and updated.     Objective:  Physical Exam  Physical Exam  Constitutional: He is oriented to person, place, and time. He appears well-developed and well-nourished. He is active and cooperative. No distress.  BP 130/70 mmHg  Pulse 85  Temp(Src) 98.1 F (36.7 C) (Oral)  Resp 16  Ht 6\' 5"  (1.956 m)  Wt 309 lb (140.161 kg)  BMI 36.63 kg/m2  SpO2 97%   Eyes: Conjunctivae are normal.  Pulmonary/Chest: Effort normal.  Musculoskeletal:       Cervical back: He exhibits tenderness and pain (on the RIGHTwith full flexion, extension,  sidebending to the RIGHT, rotation to the RIGHT.). He exhibits normal range of motion, no bony tenderness, no swelling, no edema, no deformity, no laceration, no spasm and normal pulse.  Neurological: He is alert and oriented to person, place, and time. He has normal strength. No cranial nerve deficit or sensory deficit.  Psychiatric: He has a normal mood and affect. His speech is normal and behavior is normal.           Assessment & Plan:  1. Neck pain Improving. Continue current treatment. Remain out of work. Re-evaluate in 2 days, with anticipated RTW on Monday 03/16/2014 (he doesn't work weekends).   Fernande Brashelle S. Alilah Mcmeans, PA-C Physician Assistant-Certified [facility]

## 2014-03-12 NOTE — Telephone Encounter (Signed)
[facility] [place] and everything is squared away now.

## 2014-03-13 ENCOUNTER — Ambulatory Visit (INDEPENDENT_AMBULATORY_CARE_PROVIDER_SITE_OTHER): Payer: BLUE CROSS/BLUE SHIELD | Admitting: Physician Assistant

## 2014-03-13 VITALS — BP 108/80 | HR 80 | Temp 98.3°F | Resp 18 | Ht 77.0 in | Wt 309.4 lb

## 2014-03-13 DIAGNOSIS — M542 Cervicalgia: Secondary | ICD-10-CM

## 2014-03-13 NOTE — Patient Instructions (Signed)
Continue the Naproxen and cyclobenzaprine as needed. Do the gentle range of motion exercises. Use the warm compress/heating pad.

## 2014-03-13 NOTE — Progress Notes (Signed)
   Patient ID: Logan Calderon, male    DOB: 08/27/1965, [age] y.o.   MRN: [id number]  PCP: No PCP Per Patient  Subjective:   Chief Complaint  Patient presents with  . Follow-up    for neck pain - feel better    HPI  Presents for re-evaluation of neck pain.  See previous notes. Has been using a heating pad and doing gentle ROM exercises and taking the cyclobenzaprine, but has stopped the naproxen.  Much improved. No pain or soreness now, just feels a bit tight on the RIGHT side with rotation to the RIGHT.  Still no radicular symptoms, no weakness.  Review of Systems Review of Systems As above.    Patient Active Problem List   Diagnosis Date Noted  . BMI 37.0-37.9, adult 03/09/2014    No Known Allergies  Prior to Admission medications   Medication Sig Start Date End Date Taking? Authorizing Provider  cyclobenzaprine (FLEXERIL) 10 MG tablet Take 10 mg by mouth 3 (three) times daily as needed for muscle spasms.   Yes Historical Provider, MD     Past Medical, Surgical Family and Social History reviewed and updated.        Objective:  Physical Exam  Physical Exam  Constitutional: He is oriented to person, place, and time. He appears well-developed and well-nourished. He is active and cooperative. No distress.  BP 108/80 mmHg  Pulse 80  Temp(Src) 98.3 F (36.8 C) (Oral)  Resp 18  Ht 6\' 5"  (1.956 m)  Wt 309 lb 6.4 oz (140.343 kg)  BMI 36.68 kg/m2  SpO2 97%   Eyes: Conjunctivae are normal.  Neck: Normal range of motion, full passive range of motion without pain and phonation normal. Neck supple. No spinous process tenderness and no muscular tenderness present. Normal range of motion present. No thyroid mass and no thyromegaly present.  Cardiovascular: Normal rate and regular rhythm.   Pulses:      Radial pulses are 2+ on the right side, and 2+ on the left side.  Pulmonary/Chest: Effort normal and breath sounds normal.  Musculoskeletal:       Right shoulder: Normal.         Right elbow: Normal.      Right wrist: Normal.       Cervical back: Normal.  Neurological: He is alert and oriented to person, place, and time.  Skin: Skin is warm and dry.  Psychiatric: He has a normal mood and affect. His speech is normal and behavior is normal.           Assessment & Plan:  1. Neck pain Continue the heat, ROM, and use the naproxen and cyclobenzaprine PRN. RTW regular duty on Monday 3/07, but if he has problems return for re-evaluation. Counseled on the use of good body mechanics with lifting to reduce the risk of re-injury.   Fernande Brashelle S. Zya Finkle, PA-C Physician Assistant-Certified [facility]

## 2014-05-03 ENCOUNTER — Encounter (HOSPITAL_COMMUNITY): Payer: Self-pay | Admitting: Emergency Medicine

## 2014-05-03 ENCOUNTER — Emergency Department (HOSPITAL_COMMUNITY)
Admission: EM | Admit: 2014-05-03 | Discharge: 2014-05-04 | Disposition: A | Discharge status: Home / Self Care | Payer: BLUE CROSS/BLUE SHIELD | Attending: Emergency Medicine | Admitting: Emergency Medicine

## 2014-05-03 DIAGNOSIS — M5440 Lumbago with sciatica, unspecified side: Secondary | ICD-10-CM | POA: Diagnosis not present

## 2014-05-03 DIAGNOSIS — M5441 Lumbago with sciatica, right side: Secondary | ICD-10-CM

## 2014-05-03 DIAGNOSIS — M5442 Lumbago with sciatica, left side: Secondary | ICD-10-CM

## 2014-05-03 DIAGNOSIS — Z8719 Personal history of other diseases of the digestive system: Secondary | ICD-10-CM | POA: Insufficient documentation

## 2014-05-03 DIAGNOSIS — M545 Low back pain: Secondary | ICD-10-CM | POA: Diagnosis present

## 2014-05-03 MED ORDER — METHOCARBAMOL 500 MG PO TABS
1000.0000 mg | ORAL_TABLET | Freq: Once | ORAL | Status: AC
  Administered 2014-05-04: 1000 mg via ORAL
  Filled 2014-05-03: qty 2

## 2014-05-03 MED ORDER — NAPROXEN 500 MG PO TABS: 500.0000 mg | ORAL_TABLET | Freq: Two times a day (BID) | ORAL | Status: DC

## 2014-05-03 MED ORDER — NAPROXEN 500 MG PO TABS
500.0000 mg | ORAL_TABLET | Freq: Once | ORAL | Status: AC
  Administered 2014-05-04: 500 mg via ORAL
  Filled 2014-05-03: qty 1

## 2014-05-03 MED ORDER — METHOCARBAMOL 750 MG PO TABS: 750.0000 mg | ORAL_TABLET | Freq: Four times a day (QID) | ORAL | Status: DC

## 2014-05-03 NOTE — ED Notes (Signed)
Pt c/o mid, lower back pain starting Saturday. Reports pain radiates to bilateral legs and down the buttocks. Couldn't pinpoint an exact event which precipitated pain. Says, "I pulled a lower lumbar muscle a long time ago." Has been taking Aleve without alleviation of symptoms. Ambulatory with steady gait. No other c/c.

## 2014-05-03 NOTE — Discharge Instructions (Signed)
Take medications as prescribed.  Do not take Robaxin when you're driving.  Follow-up with your Dr. for recheck in one week.  Return to the emergency Department for loss of bowel or bladder control, weakness, or worsening numbness into the legs. Or other new concerning symptoms.   Lumbosacral Strain Lumbosacral strain is a strain of any of the parts that make up your lumbosacral vertebrae. Your lumbosacral vertebrae are the bones that make up the lower third of your backbone. Your lumbosacral vertebrae are held together by muscles and tough, fibrous tissue (ligaments).  CAUSES  A sudden blow to your back can cause lumbosacral strain. Also, anything that causes an excessive stretch of the muscles in the low back can cause this strain. This is typically seen when people exert themselves strenuously, fall, lift heavy objects, bend, or crouch repeatedly. RISK FACTORS  Physically demanding work.  Participation in pushing or pulling sports or sports that require a sudden twist of the back (tennis, golf, baseball).  Weight lifting.  Excessive lower back curvature.  Forward-tilted pelvis.  Weak back or abdominal muscles or both.  Tight hamstrings. SIGNS AND SYMPTOMS  Lumbosacral strain may cause pain in the area of your injury or pain that moves (radiates) down your leg.  DIAGNOSIS Your health care provider can often diagnose lumbosacral strain through a physical exam. In some cases, you may need tests such as X-ray exams.  TREATMENT  Treatment for your lower back injury depends on many factors that your clinician will have to evaluate. However, most treatment will include the use of anti-inflammatory medicines. HOME CARE INSTRUCTIONS   Avoid hard physical activities (tennis, racquetball, waterskiing) if you are not in proper physical condition for it. This may aggravate or create problems.  If you have a back problem, avoid sports requiring sudden body movements. Swimming and walking are  generally safer activities.  Maintain good posture.  Maintain a healthy weight.  For acute conditions, you may put ice on the injured area.  Put ice in a plastic bag.  Place a towel between your skin and the bag.  Leave the ice on for 20 minutes, 2-3 times a day.  When the low back starts healing, stretching and strengthening exercises may be recommended. SEEK MEDICAL CARE IF:  Your back pain is getting worse.  You experience severe back pain not relieved with medicines. SEEK IMMEDIATE MEDICAL CARE IF:   You have numbness, tingling, weakness, or problems with the use of your arms or legs.  There is a change in bowel or bladder control.  You have increasing pain in any area of the body, including your belly (abdomen).  You notice shortness of breath, dizziness, or feel faint.  You feel sick to your stomach (nauseous), are throwing up (vomiting), or become sweaty.  You notice discoloration of your toes or legs, or your feet get very cold. MAKE SURE YOU:   Understand these instructions.  Will watch your condition.  Will get help right away if you are not doing well or get worse. Document Released: 10/05/2004 Document Revised: 12/31/2012 Document Reviewed: 08/14/2012 [facility] Patient Information 2015 [place], [place]. This information is not intended to replace advice given to you by your health care provider. Make sure you discuss any questions you have with your health care provider.  Back Exercises Back exercises help treat and prevent back injuries. The goal is to increase your strength in your belly (abdominal) and back muscles. These exercises can also help with flexibility. Start these exercises when told  by your doctor. HOME CARE Back exercises include: Pelvic Tilt.  Lie on your back with your knees bent. Tilt your pelvis until the lower part of your back is against the floor. Hold this position 5 to 10 sec. Repeat this exercise 5 to 10 times. Knee to  Chest.  Pull 1 knee up against your chest and hold for 20 to 30 seconds. Repeat this with the other knee. This may be done with the other leg straight or bent, whichever feels better. Then, pull both knees up against your chest. Sit-Ups or Curl-Ups.  Bend your knees 90 degrees. Start with tilting your pelvis, and do a partial, slow sit-up. Only lift your upper half 30 to 45 degrees off the floor. Take at least 2 to 3 seonds for each sit-up. Do not do sit-ups with your knees out straight. If partial sit-ups are difficult, simply do the above but with only tightening your belly (abdominal) muscles and holding it as told. Hip-Lift.  Lie on your back with your knees flexed 90 degrees. Push down with your feet and shoulders as you raise your hips 2 inches off the floor. Hold for 10 seconds, repeat 5 to 10 times. Back Arches.  Lie on your stomach. Prop yourself up on bent elbows. Slowly press on your hands, causing an arch in your low back. Repeat 3 to 5 times. Shoulder-Lifts.  Lie face down with arms beside your body. Keep hips and belly pressed to floor as you slowly lift your head and shoulders off the floor. Do not overdo your exercises. Be careful in the beginning. Exercises may cause you some mild back discomfort. If the pain lasts for more than 15 minutes, stop the exercises until you see your doctor. Improvement with exercise for back problems is slow.  Document Released: 01/28/2010 Document Revised: 03/20/2011 Document Reviewed: 10/27/2010 [facility] Patient Information 2015 [place]. This information is not intended to replace advice given to you by your health care provider. Make sure you discuss any questions you have with your health care provider.

## 2014-05-03 NOTE — ED Notes (Signed)
Pt complains of mid lower back pain that radiates to bilateral thigh. Denies numbness, tingling, or loss of bowel/bladder. Denies any recent injury.

## 2014-05-03 NOTE — ED Provider Notes (Signed)
CSN: [id number]     Arrival date & time 05/03/14  2233 History   First MD Initiated Contact with Patient 05/03/14 2302     Chief Complaint  Patient presents with  . Back Pain  . Leg Pain     (Consider location/radiation/quality/duration/timing/severity/associated sxs/prior Treatment) HPI 49 year old male presents to emergency department with complaint of low back pain with radiation into his thighs.  Patient describes the pain as thighs as a burning sensation.  He denies any bowel or bladder incontinence.  Pain started yesterday.  He denies any known trauma or overexertion.  He reports that he was doing his regular daily chores.  Pain extends across his low back.  Pain is worse with movement.  Patient reports remote history of lumbar strain.  Patient is been taking ibuprofen, 400 mg without improvement in symptoms.  He denies any fever, chills, no weight loss, no foot drop or other red flags. Past Medical History  Diagnosis Date  . Gastric reflux    History reviewed. No pertinent past surgical history. Family History  Problem Relation Age of Onset  . Diabetes Sister   . Kidney failure Sister   . Diabetes Mother    History  Substance Use Topics  . Smoking status: Never Smoker   . Smokeless tobacco: Never Used  . Alcohol Use: No    Review of Systems  See History of Present Illness; otherwise all other systems are reviewed and negative   Allergies  Review of patient's allergies indicates no known allergies.  Home Medications   Prior to Admission medications   Medication Sig Start Date End Date Taking? Authorizing Provider  naproxen sodium (ANAPROX) 220 MG tablet Take 440 mg by mouth 2 (two) times daily as needed (pain).   Yes Historical Provider, MD  cyclobenzaprine (FLEXERIL) 10 MG tablet Take 10 mg by mouth 3 (three) times daily as needed for muscle spasms.    Historical Provider, MD  methocarbamol (ROBAXIN-750) 750 MG tablet Take 1 tablet (750 mg total) by mouth 4 (four)  times daily. 05/03/14   Marisa Severinlga Preslie Depasquale, MD  naproxen (NAPROSYN) 500 MG tablet Take 1 tablet (500 mg total) by mouth 2 (two) times daily with a meal. 05/04/14   Marisa Severinlga Aayan Haskew, MD   BP 118/85 mmHg  Pulse 86  Temp(Src) 97.4 F (36.3 C) (Oral)  Resp 16  SpO2 100% Physical Exam  Constitutional: He is oriented to person, place, and time. He appears well-developed and well-nourished. No distress.  HENT:  Head: Normocephalic and atraumatic.  Nose: Nose normal.  Mouth/Throat: Oropharynx is clear and moist.  Eyes: Conjunctivae and EOM are normal. Pupils are equal, round, and reactive to light.  Neck: Normal range of motion. Neck supple. No JVD present. No tracheal deviation present. No thyromegaly present.  Cardiovascular: Normal rate, regular rhythm, normal heart sounds and intact distal pulses.  Exam reveals no gallop and no friction rub.   No murmur heard. Pulmonary/Chest: Effort normal and breath sounds normal. No stridor. No respiratory distress. He has no wheezes. He has no rales. He exhibits no tenderness.  Abdominal: Soft. Bowel sounds are normal. He exhibits no distension and no mass. There is no tenderness. There is no rebound and no guarding.  Musculoskeletal: Normal range of motion. He exhibits no edema or tenderness.  Cervical, thoracic and lumbar spine palpated.  There is no step-off or crepitus.  He has mild paraspinal tenderness to mid to lower lumbar.  There is no SI tenderness.  Patient is able to move all  extremities.  He has 5 out of 5 strength in all extremities.  Reflexes are normal.  Lymphadenopathy:    He has no cervical adenopathy.  Neurological: He is alert and oriented to person, place, and time. He displays normal reflexes. He exhibits normal muscle tone. Coordination normal.  Skin: Skin is warm and dry. No rash noted. No erythema. No pallor.  Psychiatric: He has a normal mood and affect. His behavior is normal. Judgment and thought content normal.  Nursing note and vitals  reviewed.   ED Course  Procedures (including critical care time) Labs Review Labs Reviewed - No data to display  Imaging Review No results found.   EKG Interpretation None      MDM   Final diagnoses:  Bilateral low back pain with sciatica, sciatica laterality unspecified    49 year old male with acute low back pain with some radicular symptoms.  Plan to start on Robaxin and Naprosyn.  Patient to follow-up with primary care doctor if not improving in 7-10 days.    Marisa Severin, MD 05/04/14 [id number]

## 2014-07-28 ENCOUNTER — Ambulatory Visit (INDEPENDENT_AMBULATORY_CARE_PROVIDER_SITE_OTHER): Payer: BLUE CROSS/BLUE SHIELD | Admitting: Physician Assistant

## 2014-07-28 VITALS — BP 134/78 | HR 86 | Temp 98.8°F | Resp 18 | Ht 76.0 in | Wt 311.0 lb

## 2014-07-28 DIAGNOSIS — M545 Low back pain, unspecified: Secondary | ICD-10-CM

## 2014-07-28 MED ORDER — DICLOFENAC SODIUM 75 MG PO TBEC: 75.0000 mg | DELAYED_RELEASE_TABLET | Freq: Two times a day (BID) | ORAL | Status: DC

## 2014-07-28 MED ORDER — TRAMADOL HCL 50 MG PO TABS: 50.0000 mg | ORAL_TABLET | Freq: Three times a day (TID) | ORAL | Status: DC | PRN

## 2014-07-28 MED ORDER — KETOROLAC TROMETHAMINE 30 MG/ML IJ SOLN
30.0000 mg | Freq: Once | INTRAMUSCULAR | Status: AC
  Administered 2014-07-28: 30 mg via INTRAMUSCULAR

## 2014-07-28 NOTE — Patient Instructions (Signed)

## 2014-07-28 NOTE — Progress Notes (Signed)
   Subjective:    Patient ID: Logan StaggersEric L Ellwanger, male    DOB: 1965/05/26, 49 y.o.   MRN: [id number]  HPI Patient presents for back pain that has been present for past 4 days. Back was uncomfortable when he woke up over the weekend and was 1/10 achy discomfort. Pain gradually increase when he went back to work this week. 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 When bending over today then standing up felt intense pain in lower back that radiated to both thighs. Pain is now 6/10 and feels worst with prolonged sitting and when attempting to lay down. Denies numbness/tingling/weakness back, lower extremities, or toes. No h/o back procedures. NKDA.   Review of Systems  Constitutional: Negative.  Negative for activity change.  Musculoskeletal: Positive for myalgias and back pain. Negative for joint swelling, arthralgias, gait problem, neck pain and neck stiffness.  Skin: Negative.   Neurological: Negative for weakness and numbness.       Objective:   Physical Exam  Constitutional: He is oriented to person, place, and time. He appears well-developed and well-nourished. No distress.  Blood pressure 134/78, pulse 86, temperature 98.8 F (37.1 C), temperature source Oral, resp. rate 18, height 6\' 4"  (1.93 m), weight 311 lb (141.069 kg), SpO2 97 %.   HENT:  Head: Normocephalic and atraumatic.  Right Ear: External ear normal.  Left Ear: External ear normal.  Mouth/Throat: Oropharynx is clear and moist.  Eyes: Conjunctivae and EOM are normal. Pupils are equal, round, and reactive to light. Right eye exhibits no discharge. Left eye exhibits no discharge. No scleral icterus.  Neck: Normal range of motion. Neck supple.  Pulmonary/Chest: Effort normal.  Musculoskeletal:       Right hip: Normal.       Left hip: Normal.       Cervical back: Normal.       Thoracic back: Normal.       Lumbar back: He exhibits pain. He exhibits normal range of motion, no  tenderness, no bony tenderness, no edema, no deformity, no laceration, no spasm and normal pulse.  Some pain with resistance testing of legs.   Lymphadenopathy:    He has no cervical adenopathy.  Neurological: He is alert and oriented to person, place, and time. He has normal strength. He displays no atrophy. No cranial nerve deficit or sensory deficit. He exhibits normal muscle tone. Coordination normal.  Skin: Skin is warm and dry. No rash noted. He is not diaphoretic. No erythema. No pallor.  Psychiatric: He has a normal mood and affect. His behavior is normal. Judgment and thought content normal.       Assessment & Plan:  1. Bilateral low back pain without sciatica Back exercises given. Note for work given to allow light duty for rest of week. Alternate ice and heat.  - ketorolac (TORADOL) 30 MG/ML injection 30 mg; Inject 1 mL (30 mg total) into the muscle once. - diclofenac (VOLTAREN) 75 MG EC tablet; Take 1 tablet (75 mg total) by mouth 2 (two) times daily.  Dispense: 30 tablet; Refill: 0 - traMADol (ULTRAM) 50 MG tablet; Take 1 tablet (50 mg total) by mouth every 8 (eight) hours as needed.  Dispense: 30 tablet; Refill: 0   Leanny Moeckel PA-C  [facility] 07/28/2014 2:00 PM

## 2017-01-10 IMAGING — CR DG CERVICAL SPINE COMPLETE 4+V
6 series · 6 of 6 positions shown · non-contrast
Comparison: None.

CLINICAL DATA: Right-sided neck pain with radicular pain radiating
to the right neck

EXAM:
CERVICAL SPINE  4+ VIEWS

[lateral]
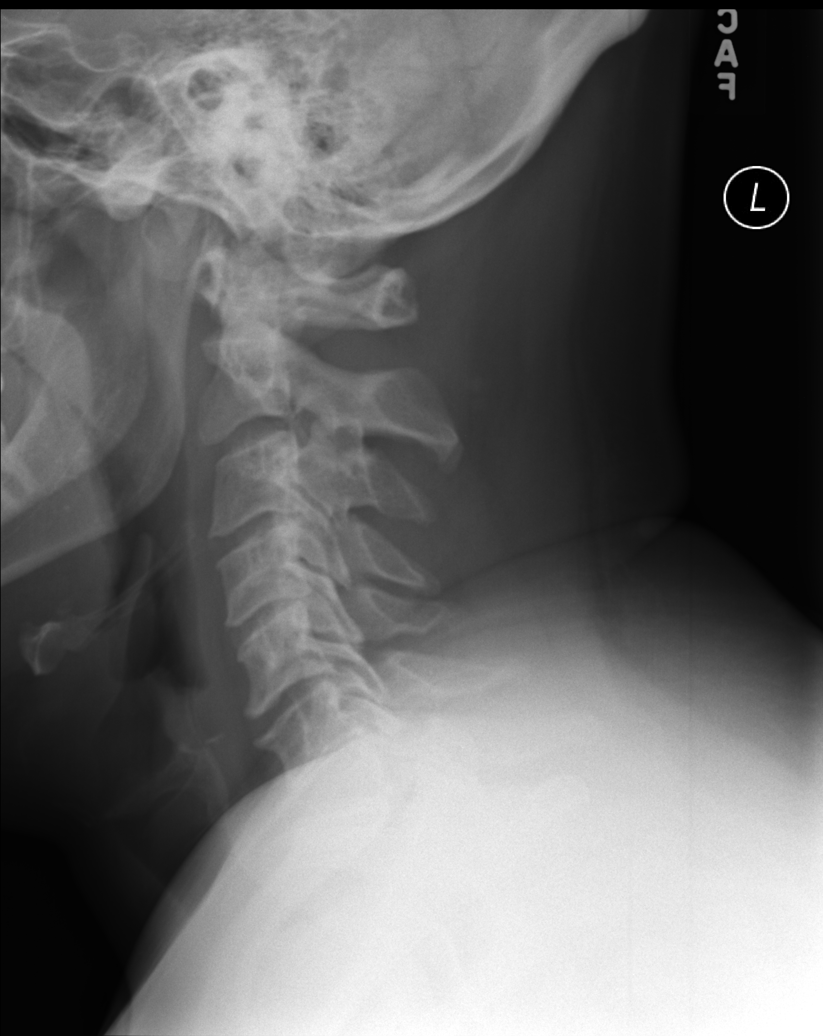

[rpo]
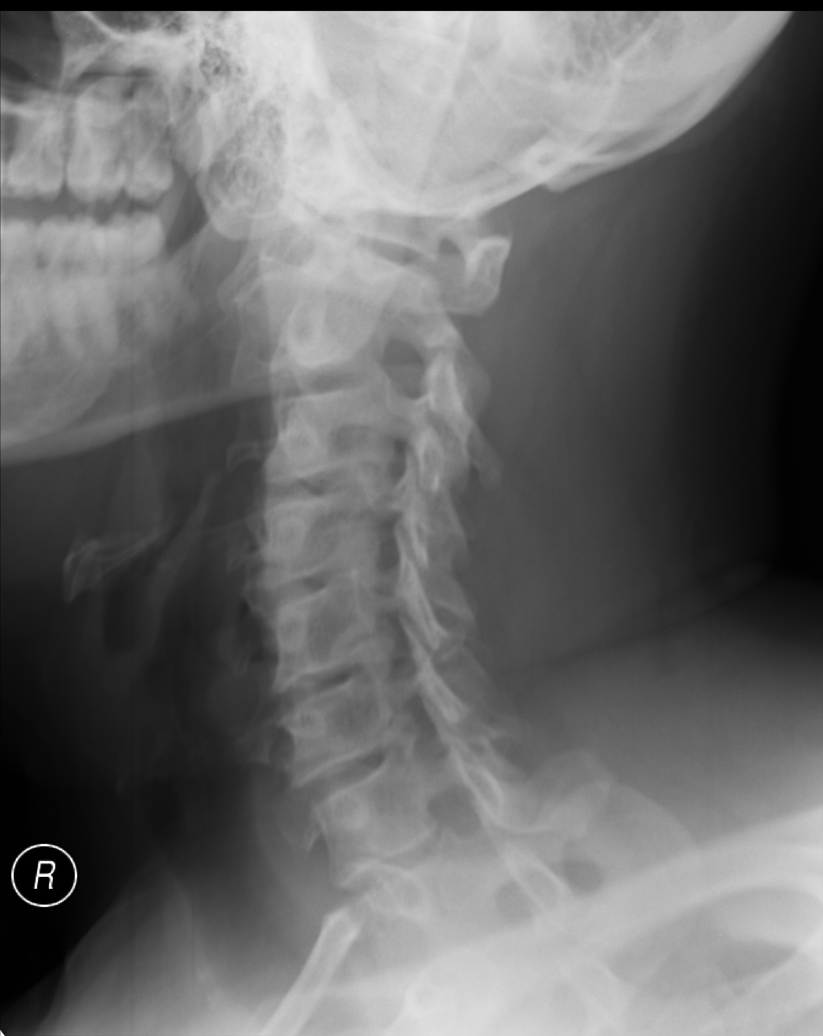

[AP]
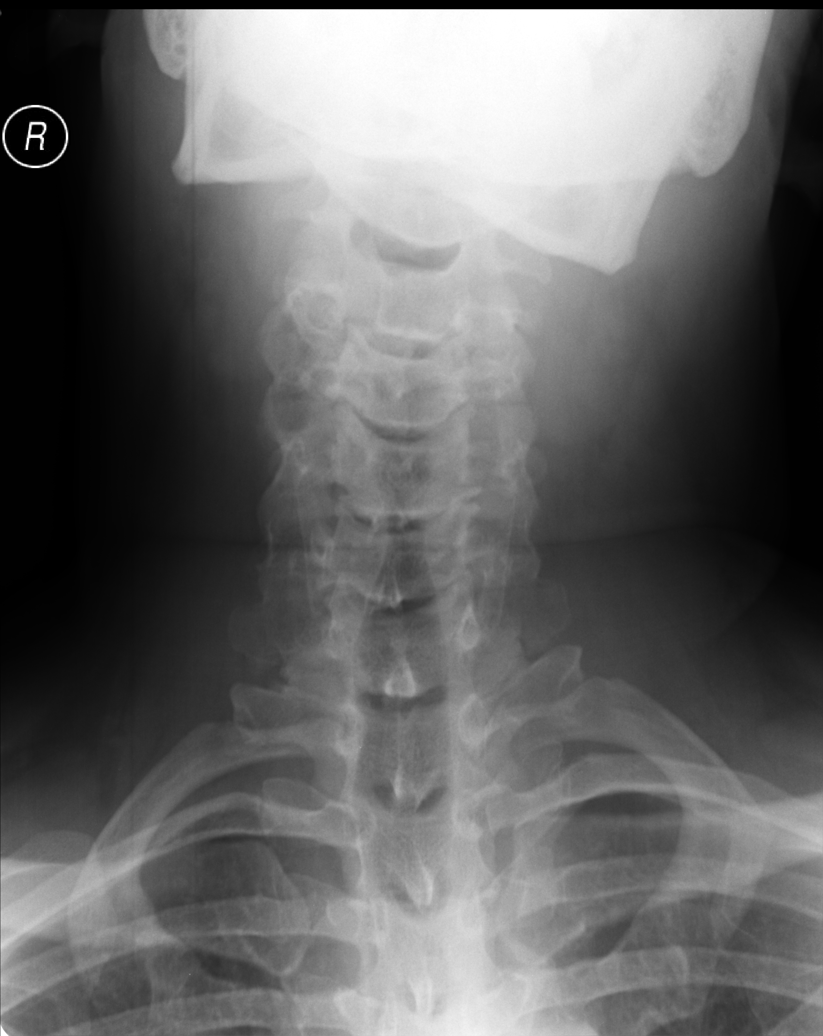

[ap open mouth]
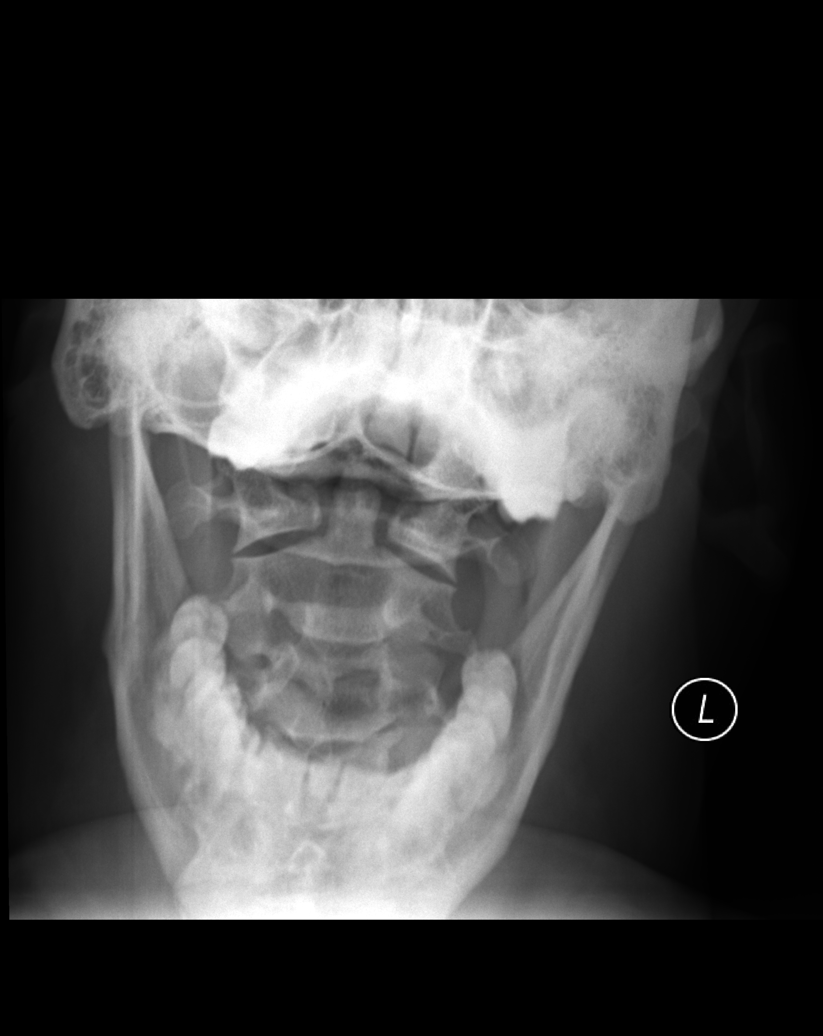

[swimmers]
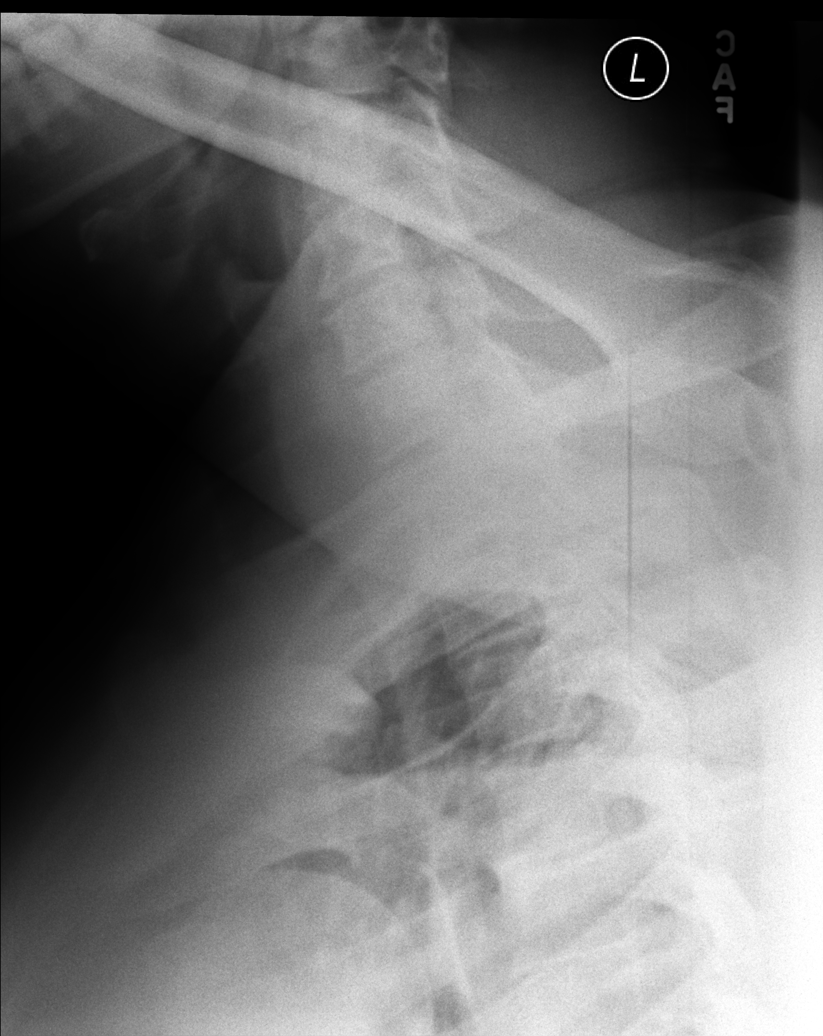

[lpo]
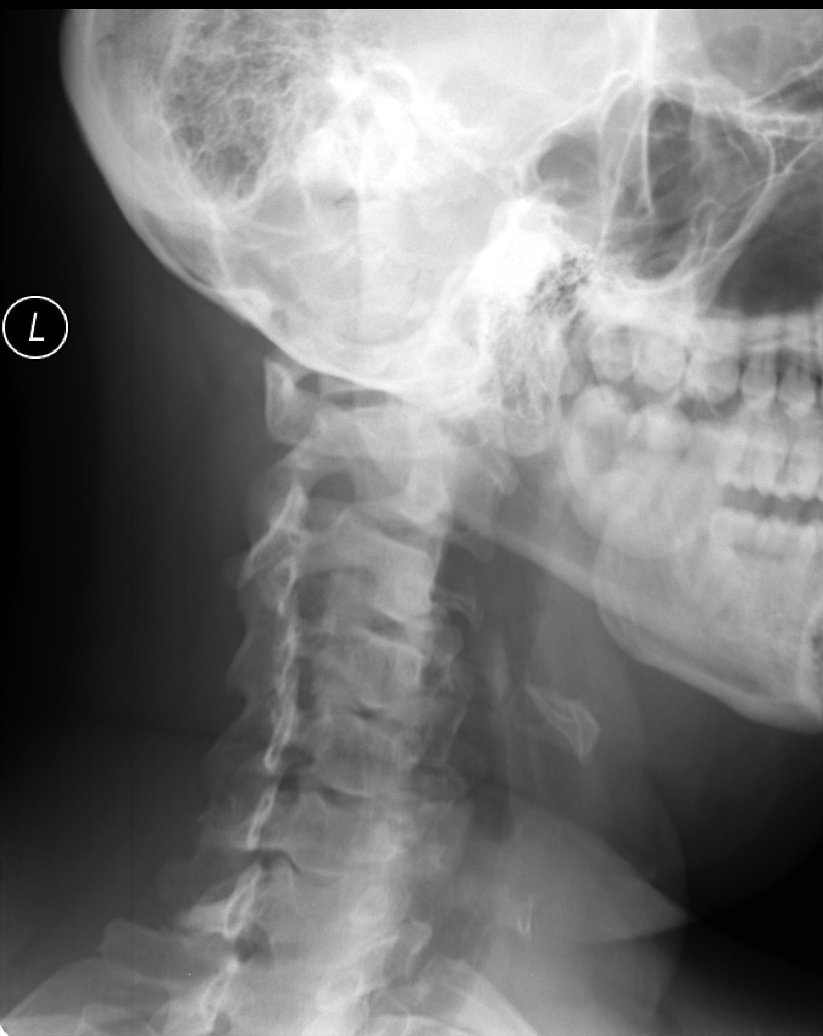

[6 of 6 positions shown; findings below may reference images not displayed]

FINDINGS: There is no evidence of cervical spine fracture or prevertebral soft
tissue swelling. Alignment is normal. No other significant bone
abnormalities are identified. C1 through C6 is visualized. C7
inferiorly not well visualized due to obscuration by soft tissue.
Minimal disc degenerative change noted C4-C5.
IMPRESSION: No acute abnormality.  Minimal disc degenerative change, C4-C5.

## 2019-12-24 ENCOUNTER — Ambulatory Visit: Payer: Self-pay | Admitting: Licensed Clinical Social Worker

## 2019-12-24 ENCOUNTER — Encounter: Payer: Self-pay | Admitting: Family Medicine

## 2019-12-24 ENCOUNTER — Ambulatory Visit (INDEPENDENT_AMBULATORY_CARE_PROVIDER_SITE_OTHER): Payer: Self-pay | Admitting: Family Medicine

## 2019-12-24 ENCOUNTER — Other Ambulatory Visit: Payer: Self-pay

## 2019-12-24 VITALS — BP 118/78 | HR 76 | Resp 16 | Ht 76.0 in | Wt 181.8 lb

## 2019-12-24 DIAGNOSIS — R634 Abnormal weight loss: Secondary | ICD-10-CM

## 2019-12-24 DIAGNOSIS — Z1211 Encounter for screening for malignant neoplasm of colon: Secondary | ICD-10-CM

## 2019-12-24 NOTE — Chronic Care Management (AMB) (Signed)
   Social Work  Care Management Consultation  12/24/2019 Name: Logan Calderon MRN: [id number] DOB: [birth year] Logan Calderon is a 54 y.o. year old male who sees Logan Poling, DO for primary care. LCSW was consulted by PCP for information /resources to assistance patient with  Care Coordination.     Recommendation: After consultation with provider it is determined that patient may benefit from [facility], [facility] financial assistance application and Colonoscopy free screening information.  Intervention: Patient was not interviewed or contacted during this encounter.    LCSW collaborated with PCP, conducted brief assessment and provided recommendations.   Relevant information and resources discussed with provider.  Provider give the following to patient.  ([employer] account executive, [facility] financial assistance application and Colonoscopy free screening information [facility] [employer] [phone] ).  Plan: LCSW will f/u with patient after PCP places formal CCM SW referral.   Review of patient status, including review of consultants reports, relevant laboratory and other test results, and collaboration with appropriate care team members and the patient's provider was performed as part of comprehensive patient evaluation and provision of chronic care management services.      Sammuel Hines, LCSW Care Management & Coordination  [facility] / [facility]   [phone] 2:26 PM

## 2019-12-24 NOTE — Progress Notes (Signed)
Pt states since last year he hasn't been eating a bunch of junk food and eating regular and he has been losing weight  Pt states he has been losing weight in thighs and his skin is irratated  Pt states when his boxers or pants rub on his legs his thighs starts to itch   Pt states since he lost weight in his thighs they have been feeling sore

## 2019-12-24 NOTE — Assessment & Plan Note (Addendum)
Assessment: 54 year old gentleman presenting to establish care after not seeing a physician in over 10 years who has 15 to 20 pounds of unintentional weight loss over the past 1 year.  Patient does not have any other associated symptoms and specifically denies any vomiting, diarrhea, fevers, night sweats.  He does state that he only eats about 1 or 2 meals a day and does not feel hungry at other times but does feel hungry when it comes time for his meals.  He thinks that the weight loss may be due to cutting back fast food during the COVID-19 pandemic.  He denies any family history of malignancies.  Prior to establishing with [place] he denies any actual medical conditions and does not take any medications.  He is a never smoker, never drinker, and has never used illicit drugs.  Physical exam with patient having a somewhat cachectic appearance and the appearance of some temporal wasting but no cervical lymphadenopathy and lungs clear to auscultation, and no pain on palpation of the abdomen.  Patient's main concern is that he does not have insurance currently and wants to make sure test ordered in a judicious fashion due to financial concerns.  Overall differential for his unintentional weight loss certainly raises the concern for malignancy and individual that has not been seen in some time.  Other differentials can include simply scaling back on his diet due to financial concerns or due to the COVID-19 pandemic.  I think it is unlikely that the patient's symptoms are due to a malabsorption issue as he has not had any vomiting or diarrhea. Plan: -Discussed patient with Sammuel Hines, our clinical social worker who is working on options to help patient afford some of the recommended screening and other testing. -Patient was given forms for the orange card and other financial assistance applications to complete -We will order a CBC with differential and a CMP right now -We will plan to order a colonoscopy once patient  finds a means to help for this.  I spoke with the patient in detail about the recommendation to have this done but patient states he would not be able to afford this without insurance or other financial assistance.

## 2019-12-24 NOTE — Patient Instructions (Signed)
It was great to meet you!  Our plans for today:  -We are checking some blood work today.  I will call you with the results likely in the next 2 or 3 days -I have put in a referral for care coordination department and Sammuel Hines, our clinical social worker may reach out to you to help with affording the colonoscopy which I do recommend getting. -If you have any other concerns do not hesitate to reach out.   We are checking some labs today, I would like for you to make a follow-up appointment in the next 3 to 4 days or next week to discuss the results of these labs as well as to talk in further detail about ways we can assist you with your health.  Take care and seek immediate care sooner if you develop any concerns.   Dr. [facility]

## 2019-12-24 NOTE — Progress Notes (Addendum)
Subjective:    Patient ID: Logan Calderon, male    DOB: [birth year], 54 y.o.   MRN: [id number]   CC: Establish care  HPI:  Logan Calderon is a very pleasant 54 y.o. male who presents today to establish care.  Initial concerns:   Unintentional weight loss - Lost around 15-20lbs unintentionally over last year. Thinks it may be due to eating less fast food but hasn't been actively trying to lose weight. Doesn't feel overly hungry but doesn't have to force himself to eat at meal times. No vomiting, diarrhea, fevers. No night sweats.  Superficial thigh pain which started since losing the weight. No rash but says his skin feels "irritated". Tried 1% hydrocortisone cream which did not help his symptoms. He states his pant legs rubbing on his thighs or from laying on the sheets of his bed seem to irritate it. Started about 6 months.  No rash on physical exam.  Past medical history: Hasn't seen a doctor in about 10years. No past medical history   Past surgical history: Cataract removal last year  Current medications: No meds.  Family history: Mother, sister, grandparents with history of diabetes.  Social history: Lives alone, never alcohol, never recreational drug use, never smoker.  Walks for exercise.  ROS: pertinent noted in the HPI   Objective:  BP 118/78   Pulse 76   Resp 16   Ht 6\' 4"  (1.93 m)   Wt 181 lb 12.8 oz (82.5 kg)   SpO2 98%   BMI 22.13 kg/m   Vitals and nursing note reviewed  General: NAD, pleasant, able to participate in exam, patient does have a somewhat cachectic appearance and does have some temporal wasting HEENT: No pharyngeal erythema, no cervical lymphadenopathy palpated Cardiac: RRR, S1 S2 present. normal heart sounds, no murmurs. Respiratory: CTAB, normal effort, No wheezes, rales or rhonchi Abdomen: Bowel sounds present, non-tender, non-distended Extremities: no edema or cyanosis. Skin: warm and dry, no rashes noted Neuro: alert, no obvious focal  deficits Psych: Normal affect and mood   Assessment & Plan:    Unintentional weight loss Assessment: 54 year old gentleman presenting to establish care after not seeing a physician in over 10 years who has 15 to 20 pounds of unintentional weight loss over the past 1 year.  Patient does not have any other associated symptoms and specifically denies any vomiting, diarrhea, fevers, night sweats.  He does state that he only eats about 1 or 2 meals a day and does not feel hungry at other times but does feel hungry when it comes time for his meals.  He thinks that the weight loss may be due to cutting back fast food during the COVID-19 pandemic.  He denies any family history of malignancies.  Prior to establishing with 46 he denies any actual medical conditions and does not take any medications.  He is a never smoker, never drinker, and has never used illicit drugs.  Physical exam with patient having a somewhat cachectic appearance and the appearance of some temporal wasting but no cervical lymphadenopathy and lungs clear to auscultation, and no pain on palpation of the abdomen.  Patient's main concern is that he does not have insurance currently and wants to make sure test ordered in a judicious fashion due to financial concerns.  Overall differential for his unintentional weight loss certainly raises the concern for malignancy and individual that has not been seen in some time.  Other differentials can include simply scaling back on his diet due  to financial concerns or due to the COVID-19 pandemic.  I think it is unlikely that the patient's symptoms are due to a malabsorption issue as he has not had any vomiting or diarrhea. Plan: -Discussed patient with Sammuel Hines, our clinical social worker who is working on options to help patient afford some of the recommended screening and other testing. -Patient was given forms for the orange card and other financial assistance applications to complete -We will  order a CBC with differential and a CMP right now -We will plan to order a colonoscopy once patient finds a means to help for this.  I spoke with the patient in detail about the recommendation to have this done but patient states he would not be able to afford this without insurance or other financial assistance.    Jackelyn Poling, DO [facility] PGY-1   This note was prepared using Dragon voice recognition software and may include unintentional dictation errors due to the inherent limitations of voice recognition software.

## 2019-12-25 LAB — COMPREHENSIVE METABOLIC PANEL
ALT: 9 IU/L (ref 0–44)
AST: 10 IU/L (ref 0–40)
Albumin/Globulin Ratio: 1.9 (ref 1.2–2.2)
Albumin: 4.2 g/dL (ref 3.8–4.9)
Alkaline Phosphatase: 63 IU/L (ref 44–121)
BUN/Creatinine Ratio: 15 (ref 9–20)
BUN: 16 mg/dL (ref 6–24)
Bilirubin Total: 0.5 mg/dL (ref 0.0–1.2)
CO2: 25 mmol/L (ref 20–29)
Calcium: 9.3 mg/dL (ref 8.7–10.2)
Chloride: 102 mmol/L (ref 96–106)
Creatinine, Ser: 1.07 mg/dL (ref 0.76–1.27)
GFR calc Af Amer: 92 mL/min/{1.73_m2} (ref >59)
GFR calc non Af Amer: 79 mL/min/{1.73_m2} (ref >59)
Globulin, Total: 2.2 g/dL (ref 1.5–4.5)
Glucose: 224 mg/dL — ABNORMAL HIGH (ref 65–99)
Potassium: 4 mmol/L (ref 3.5–5.2)
Sodium: 141 mmol/L (ref 134–144)
Total Protein: 6.4 g/dL (ref 6.0–8.5)

## 2019-12-25 LAB — CBC WITH DIFFERENTIAL/PLATELET
Basophils Absolute: 0 10*3/uL (ref 0.0–0.2)
Basos: 1 %
EOS (ABSOLUTE): 0 10*3/uL (ref 0.0–0.4)
Eos: 1 %
Hematocrit: 41.7 % (ref 37.5–51.0)
Hemoglobin: 14.3 g/dL (ref 13.0–17.7)
Immature Grans (Abs): 0 10*3/uL (ref 0.0–0.1)
Immature Granulocytes: 0 %
Lymphocytes Absolute: 1.6 10*3/uL (ref 0.7–3.1)
Lymphs: 38 %
MCH: 31.8 pg (ref 26.6–33.0)
MCHC: 34.3 g/dL (ref 31.5–35.7)
MCV: 93 fL (ref 79–97)
Monocytes Absolute: 0.3 10*3/uL (ref 0.1–0.9)
Monocytes: 8 %
Neutrophils Absolute: 2.2 10*3/uL (ref 1.4–7.0)
Neutrophils: 52 %
Platelets: 246 10*3/uL (ref 150–450)
RBC: 4.5 x10E6/uL (ref 4.14–5.80)
RDW: 12.7 % (ref 11.6–15.4)
WBC: 4.1 10*3/uL (ref 3.4–10.8)

## 2019-12-31 ENCOUNTER — Ambulatory Visit: Payer: BLUE CROSS/BLUE SHIELD | Admitting: Licensed Clinical Social Worker

## 2019-12-31 DIAGNOSIS — Z789 Other specified health status: Secondary | ICD-10-CM

## 2019-12-31 DIAGNOSIS — Z719 Counseling, unspecified: Secondary | ICD-10-CM

## 2019-12-31 NOTE — Chronic Care Management (AMB) (Signed)
°  Care Management   Clinical Social Work InitialNote  12/31/2019 Name: Logan Calderon MRN: [id number] DOB: 06/25/1965 Logan Calderon is a 54 y.o. year old male who sees Logan Poling, DO for primary care.  Patient is enrolled in a Managed Medicaid plan: No.  The Care Management team was consulted by PCP to assist the patient with [employer] .  Patient is out of work, has no insurance and needs cancer screening. Has great support system with his father who has been assisting with meeting his basic needs.  LCSW engaged with Logan Calderon today by telephone in response to provider referral for social work case management and/or care coordination services. See care plan below for details during this encounter. Follow up Plan:SW will follow up with patient by phone Jan. 13, 2022  Advanced Directives Status:Not addressed in this encounter.     SDOH (Social Determinants of Health) assessments performed: Yes , no needs identified   Patient Care Plan: General Social Work (Adult)  Problem Identified: Barriers to Treatment   Goal: Barriers to Treatment Identified / Over the next 60 days, patient will complete process that will allow him to have a colonoscopy   Start Date: 12/31/2019  Expected End Date: 04/08/2020  This Visit's Progress: On track  Priority: High  Current Barriers: PCP would like patient to have colonoscopy.    Patient has never been screened and does not have health insurance   Patient has completed and mailed [employer] card application and [employer] Assistance application  Interventions:  Collaboration with Logan Poling, DO regarding development and update of comprehensive plan of care as evidenced by provider attestation and co-signature  Inter-disciplinary care team collaboration (see longitudinal plan of care)  Provided patient with information about  [facility] Call [phone] or visit [email or web address]    Encouraged patient to call  Collaborated with primary care provider re: placing referral for colonoscopy   [facility] for consultation with LCSW and GI navigator [place] [phone] Patient Goals/Self-Care ActivitiesOver the next 14 days, patient will:    Call [facility] Financial Assistance Program Call [phone]  Follow up Plan: SW will follow up with patient by phone over the next 3 to 4 weeks       No outpatient encounter medications on file as of 12/31/2019.   No facility-administered encounter medications on file as of 12/31/2019.   Review of patient status, including review of consultants reports, relevant laboratory and other test results, and collaboration with appropriate care team members and the patient's provider was performed as part of comprehensive patient evaluation and provision of chronic care management services.       Information about Care Management services was shared with [name] today including:  1. Care Management services include personalized support from designated clinical staff supervised by his physician, including individualized plan of care and coordination with other care providers 2. Remind patient of 24/7 contact phone numbers to provider's office for assistance with urgent and routine care needs. 3. Care Management services are voluntary and patient may stop at any time .   Patient agreed to services provided today and verbal consent obtained.     Sammuel Hines, LCSW Care Management & Coordination  [facility] / [facility]   [phone] 12:33 PM

## 2019-12-31 NOTE — Patient Instructions (Addendum)
  Mr. Vanroekel  it was nice speaking with you. Please call me directly [phone] if you have questions about the goals we discussed. Goals Addressed            This Visit's Progress   . Find Help in My Community       Timeframe:  Short-Term Goal Priority:  High Start Date: 01/05/2020                            Expected End Date:  04/08/2020                     Follow Up Date Jan. 2022   Patient Goals/Self-Care ActivitiesOver the next 14 days, patient will:  .  Call [facility] Financial Assistance Program Call [phone]   Why is this important?    Knowing how and where to find help for yourself or family in your neighborhood and community is an important skill.      Mr. Ponder received Care Management services today:  1. Care Management services include personalized support from designated clinical staff supervised by his physician, including individualized plan of care and coordination with other care providers 2. 24/7 contact [phone] for assistance for urgent and routine care needs. 3. Care Management are voluntary services and be declined at any time by calling the office.  Patient verbalizes understanding of instructions provided today.    Follow up plan: Appointment scheduled for SW follow up with client by phone on: Jan. 13, 2022  [name], LCSW

## 2020-01-05 ENCOUNTER — Other Ambulatory Visit: Payer: Self-pay | Admitting: Family Medicine

## 2020-01-05 DIAGNOSIS — Z1211 Encounter for screening for malignant neoplasm of colon: Secondary | ICD-10-CM

## 2020-01-05 DIAGNOSIS — R634 Abnormal weight loss: Secondary | ICD-10-CM

## 2020-01-22 ENCOUNTER — Ambulatory Visit: Payer: BLUE CROSS/BLUE SHIELD | Admitting: Licensed Clinical Social Worker

## 2020-01-22 ENCOUNTER — Telehealth: Payer: Self-pay | Admitting: Family Medicine

## 2020-01-22 DIAGNOSIS — Z789 Other specified health status: Secondary | ICD-10-CM

## 2020-01-22 DIAGNOSIS — Z7189 Other specified counseling: Secondary | ICD-10-CM

## 2020-01-22 NOTE — Patient Instructions (Signed)
Visit Information  Patient Care Plan: General Social Work (Adult)  Problem Identified: Needs additional community support   Goal: Barriers to Treatment / Over the next 30 days, patient will connect with community resources   Start Date: 01/22/2020  Expected End Date: 03/08/2020  This Visit's Progress: On track  Priority: Medium  Current barriers:   . Patient has no income   . Acknowledges deficits with meeting this unmet need . Patient is unable to independently navigate community resource options without care coordination support Clinical Interventions:  . Collaboration with Jackelyn Poling, DO regarding development and update of comprehensive plan of care as evidenced by provider attestation and co-signature . Inter-disciplinary care team collaboration (see longitudinal plan of care) . Collaborated with appropriate clinical care team members regarding patient needs . Assessment of needs, barriers , agencies contacted, as well as how impacting . Assess willingness to receive help . Review various resources and discussed options ( DSS for food stamps, and possible stimulus check he never received etc)   . Advised patient to Call DSS and tax preparer for information  . Other interventions provided:Solution-Focused Strategies and Problem Solving Patient Goals/Self-Care Activities: Over the next 30 days . Contact DSS about food stamps . Inquire about stimulus check from 2020 Follow Up Plan: phone appointment scheduled Feb 3rd   Problem Identified: Long-Term Care Planning needed / No Advanc Directive   Goal: Effective Long-Term Care Planning /Over the next 20 days, the patient will review mailed EMMI education on Advance Directive   Start Date: 01/22/2020  Expected End Date: 04/08/2020  This Visit's Progress: On track  Priority: Low  Current barriers:   . Patient does not have an advance directive and needs education/ support in order to meet this need Clinical Goal(s): Over the next 45  days, the patient will have Advance Directive notarized and provide a copy to his provider  Interventions: . Collaboration with PCP regarding development and update of comprehensive plan of care as evidenced by provider attestation and co-signature . Inter-disciplinary care team collaboration (see longitudinal plan of care) . Assessed understanding of Advance Directives . A voluntary discussion about advanced care planning including importance of advanced directives, healthcare proxy and living will was discussed with the patient.  . Mailed patient EMMI educational information on Advance Directives as well as an Proofreader packet Patient Goals/Self-Care Activities : Over the next 30 days,  . review mailed EMMI education on Advance Directive  . complete Advance Directive packet,  . Call LCSW if you have questions  . have advance directive notarized and provide a copy to provider office Follow Up Plan:phone appointment scheduled Feb 3rd 2022    The patient verbalized understanding of instructions, educational materials, and care plan provided today. [name], LCSW

## 2020-01-22 NOTE — Telephone Encounter (Signed)
Pt walked in requesting the result of his colonoscopy the was done on yesterday.  [phone]

## 2020-01-22 NOTE — Chronic Care Management (AMB) (Unsigned)
Care Management Clinical Social Work Note  01/22/2020 Name: Logan Calderon MRN: [id number] DOB: October 10, 1965  Logan Calderon is a 55 y.o. year old male who is a primary care patient of Jackelyn Poling, DO.  The Care Management team was consulted for assistance with chronic disease management and coordination needs.  Engaged with patient by telephone for follow up visit in response to provider referral for social work chronic care management and care coordination services  Consent to Services:  Logan Calderon was given information about Care Management services today including:  1. Care Management services includes personalized support from designated clinical staff supervised by his physician, including individualized plan of care and coordination with other care providers 2. 24/7 contact phone numbers for assistance for urgent and routine care needs. 3. The patient may stop case management services at any time by phone call to the office staff.  Patient agreed to services and consent obtained.   Assessment: Patient reports getting colonoscopy yesterday.  Informed him to make sure results were sent to PCP.  His is currently experiencing difficulty due to no income and is open to exploring community support options. See Care Plan below for interventions and patient self-care actives.  Review of patient past medical history, allergies, medications, and health status, including review of relevant consultants reports was performed today as part of a comprehensive evaluation and provision of chronic care management and care coordination services.  SDOH (Social Determinants of Health) assessments and interventions performed:  SDOH Interventions   Flowsheet Row Most Recent Value  SDOH Interventions   SDOH Interventions for the Following Domains Food Insecurity, Financial Strain  Food Insecurity Interventions Assist with [employer] Strain Interventions Other (Comment)       Advanced  Directives Status: See Care Plan for related entries.  Care Plan  No Known Allergies  No outpatient encounter medications on file as of 01/22/2020.   No facility-administered encounter medications on file as of 01/22/2020.    Patient Active Problem List   Diagnosis Date Noted  . Unintentional weight loss 12/24/2019    Conditions to be addressed/monitored: ;Advance Directive; Lacks knowledge of community resource: for support  Care Plan : General Social Work (Adult)  Updates made by [name], LCSW since 01/22/2020 12:00 AM  Goal: Barriers to Treatment Identified / Over the next 60 days, patient will complete process that will allow him to have a colonoscopy Completed 01/22/2020  Start Date: 12/31/2019  Expected End Date: 04/08/2020  This Visit's Progress: On track  Recent Progress: On track  Priority: High  Current Barriers: PCP would like patient to have colonoscopy.   . Patient has never been screened and does not have health insurance  . Patient has completed and mailed Orange card application and [employer] application  Interventions: . Collaboration with Jackelyn Poling, DO regarding development and update of comprehensive plan of care as evidenced by provider attestation and co-signature . Inter-disciplinary care team collaboration (see longitudinal plan of care) . Provided patient with information about  [facility] Assistance Program Call [phone] or visit TheyConnect.es  . Encouraged patient to call . Collaborated with primary care provider re: placing referral for colonoscopy  . Called [facility] for consultation with LCSW and GI navigator [place]ers [phone] Patient Goals/Self-Care ActivitiesOver the next 14 days, patient will:  .  Call [facility] Financial Assistance Program Call [phone]  Follow up Plan: SW will follow up with patient by phone  over the next 3 to  4 weeks     Problem: Needs additional community support   Goal: Barriers to Treatment / Over the next 30 days, patient will connect with community resources   Start Date: 01/22/2020  Expected End Date: 03/08/2020  This Visit's Progress: On track  Priority: Medium  Current barriers:   . Patient has no income   . Acknowledges deficits with meeting this unmet need . Patient is unable to independently navigate community resource options without care coordination support Clinical Interventions:  . Collaboration with Jackelyn Poling, DO regarding development and update of comprehensive plan of care as evidenced by provider attestation and co-signature . Inter-disciplinary care team collaboration (see longitudinal plan of care) . Collaborated with appropriate clinical care team members regarding patient needs . Assessment of needs, barriers , agencies contacted, as well as how impacting . Assess willingness to receive help . Review various resources and discussed options ( DSS for food stamps, and possible stimulus check he never received etc)   . Advised patient to Call DSS and tax preparer for information  . Other interventions provided:Solution-Focused Strategies and Problem Solving Patient Goals/Self-Care Activities: Over the next 30 days . Contact DSS about food stamps . Inquire about stimulus check from 2020 Follow Up Plan: phone appointment scheduled Feb 3rd   Problem: Long-Term Care Planning needed / No Advanc Directive   Goal: Effective Long-Term Care Planning /Over the next 20 days, the patient will review mailed EMMI education on Advance Directive   Start Date: 01/22/2020  Expected End Date: 04/08/2020  This Visit's Progress: On track  Priority: Low  Current barriers:   . Patient does not have an advance directive and needs education/ support in order to meet this need Clinical Goal(s): Over the next 45 days, the patient will have Advance Directive notarized and provide a copy to his  provider  Interventions: . Collaboration with PCP regarding development and update of comprehensive plan of care as evidenced by provider attestation and co-signature . Inter-disciplinary care team collaboration (see longitudinal plan of care) . Assessed understanding of Advance Directives . A voluntary discussion about advanced care planning including importance of advanced directives, healthcare proxy and living will was discussed with the patient.  . Mailed patient EMMI educational information on Advance Directives as well as an Proofreader packet Patient Goals/Self-Care Activities : Over the next 30 days,  . review mailed EMMI education on Advance Directive  . complete Advance Directive packet,  . Call LCSW if you have questions  . have advance directive notarized and provide a copy to provider office Follow Up Plan:phone appointment scheduled Feb 3rd 2022      Sammuel Hines, LCSW Care Management & Coordination  [facility] / [facility]   [phone] 4:13 PM

## 2020-02-04 ENCOUNTER — Ambulatory Visit: Payer: BLUE CROSS/BLUE SHIELD | Admitting: Licensed Clinical Social Worker

## 2020-02-04 DIAGNOSIS — Z7189 Other specified counseling: Secondary | ICD-10-CM

## 2020-02-04 NOTE — Patient Instructions (Signed)
Visit Information  Goals Addressed            This Visit's Progress   . Effective Long-Term Care Planning   Not on track    Timeframe:  Short-Term Goal Priority:  Low Start Date:  01/22/2020                           Expected End Date:  04/08/2020                     Patient Goals/Self-Care Activities :  . review mailed EMMI education on Advance Directive  . complete Advance Directive packet,  . Call LCSW if you have questions  . have advance directive notarized and provide a copy to provider office    . Find Help in My Community   On track    Timeframe:  Short-Term Goal Priority:  High Start Date: 01/05/2020                            Expected End Date:  04/08/2020                      Patient Goals/Self-Care Activities: Over the next 30 days . Contact DSS about food stamps . Inquire about stimulus check from 2020  . Get information for [employer]  Why is this important?    Knowing how and where to find help for yourself or family in your neighborhood and community is an important skill.       The patient verbalized understanding of instructions,  No further follow up scheduled at this time. Will check in with patient as needed in 30 days  [name], LCSW

## 2020-02-04 NOTE — Chronic Care Management (AMB) (Signed)
Care Management Clinical Social Work Note  02/04/2020 Name: CLANCEY WELTON MRN: [id number] DOB: 12/21/1965  [name] is a 55 y.o. year old male who is a primary care patient of Jackelyn Poling, DO.  The Care Management team was consulted for assistance with chronic disease management and coordination needs.  Engaged with patient by telephone for follow up visit in response to provider referral for social work chronic care management and care coordination services  Consent to Services:  Mr. Jenniges was given information about Care Management services today including:  1. Care Management services includes personalized support from designated clinical staff supervised by his physician, including individualized plan of care and coordination with other care providers 2. 24/7 contact phone numbers for assistance for urgent and routine care needs. 3. The patient may stop case management services at any time by phone call to the office staff.  Patient agreed to services and consent obtained.   Assessment: Patient continues to experience difficulty with getting needed information for [facility].. Reviewed steps with patient and provided options of getting information. See Care Plan below for interventions and patient self-care actives. Follow up Plan: Patient would like continued CCM support.  No f/u appointment scheduled. LCSW will have 30 to 60 day check in.  Patient will call office if needed prior to next encounter  Review of patient past medical history, allergies, medications, and health status, including review of relevant consultants reports was performed today as part of a comprehensive evaluation and provision of chronic care management and care coordination services.  SDOH (Social Determinants of Health) assessments and interventions performed:    Advanced Directives Status: See Care Plan for related entries.  Care Plan  No Known Allergies  No outpatient encounter medications on file  as of 02/04/2020.   No facility-administered encounter medications on file as of 02/04/2020.    Patient Active Problem List   Diagnosis Date Noted  . Unintentional weight loss 12/24/2019    Conditions to be addressed/monitored:  Advance directive and community support  Care Plan : General Social Work (Adult)  Updates made by [name], LCSW since 02/04/2020 12:00 AM  Problem: Needs additional community support   Goal: Barriers to Treatment / Over the next 30 days, patient will connect with community resources   Start Date: 01/22/2020  Expected End Date: 03/08/2020  Recent Progress: On track  Priority: Medium  Current barriers:   . Patient has no income   . Acknowledges deficits with meeting this unmet need . Patient is unable to independently navigate community resource options without care coordination support Clinical Interventions:  . Collaboration with Jackelyn Poling, DO regarding development and update of comprehensive plan of care as evidenced by provider attestation and co-signature . Inter-disciplinary care team collaboration (see longitudinal plan of care) . Collaborated with appropriate clinical care team members regarding patient needs . Assessment of needs, barriers , agencies contacted, as well as how impacting . Review progress made with various resources  ( DSS for food stamps, and possible stimulus check he never received etc)   . Advised patient to Call DSS and tax preparer for information  . Other interventions provided:Solution-Focused Strategies and Problem Solving Patient Goals/Self-Care Activities: Over the next 30 days . Contact DSS about food stamps . Inquire about stimulus check from 2020 Follow Up Plan:    Problem: Long-Term Care Planning needed / No Advanc Directive   Goal: Effective Long-Term Care Planning /Over the next 20 days, the patient will review mailed EMMI  education on Advance Directive   Start Date: 01/22/2020  Expected End Date: 04/08/2020   This Visit's Progress: Not on track  Recent Progress: On track  Priority: Low  Current barriers:   . Patient does not have an advance directive and needs education/ support in order to meet this need . Trying to decide who he would like to be his health care POA Clinical Goal(s): Over the next 45 days, the patient will have Advance Directive notarized and provide a copy to his provider  Interventions: . Collaboration with PCP regarding development and update of comprehensive plan of care as evidenced by provider attestation and co-signature . Inter-disciplinary care team collaboration (see longitudinal plan of care) . Assessed understanding of Advance Directives . A voluntary discussion about advanced care planning including importance of advanced directives, healthcare proxy and living will was discussed with the patient.  . Reviewed Advance Directives packet and answered questions  Patient Goals/Self-Care Activities : Over the next 30 days,  . review mailed EMMI education on Advance Directive  . complete Advance Directive packet,  . Call LCSW if you have questions  . have advance directive notarized and provide a copy to provider office

## 2020-02-12 ENCOUNTER — Ambulatory Visit: Payer: BLUE CROSS/BLUE SHIELD | Admitting: Licensed Clinical Social Worker

## 2020-02-12 ENCOUNTER — Telehealth: Payer: BLUE CROSS/BLUE SHIELD

## 2020-02-12 DIAGNOSIS — Z5989 Other problems related to housing and economic circumstances: Secondary | ICD-10-CM

## 2020-02-12 DIAGNOSIS — Z719 Counseling, unspecified: Secondary | ICD-10-CM

## 2020-02-12 NOTE — Patient Instructions (Signed)
Visit Information  Goals Addressed            This Visit's Progress   . COMPLETED: Find Help in My Community       Timeframe:  Short-Term Goal Priority:  High Start Date: 01/05/2020                            Expected End Date:  06/08/2020                      Patient Goals/Self-Care Activities:  . Patient will attend all scheduled provider appointments . Patient will call provider office for new concerns or questions . Get back taxes completed Why is this important?    Knowing how and where to find help for yourself or family in your neighborhood and community is an important skill.       Patient verbalizes understanding of instructions provided today.   Telephone follow up appointment with care management team member scheduled for: 75 days if no call is received from patient   [name], LCSW

## 2020-02-12 NOTE — Chronic Care Management (AMB) (Signed)
Care Management Clinical Social Work Note  02/12/2020 Name: Logan Calderon MRN: [id number] DOB: Mar 18, 1965  Logan Calderon is a 55 y.o. year old male who is a primary care patient of Lurline Del, DO.  The Care Management team was consulted for assistance with care coordination needs.  Engaged with patient by telephone for follow up visit in response to provider referral for social work care coordination services  Consent to Services:  Patient agreed to services and consent obtained.   Assessment: Patient is making progress with completing goals discussed,  All information has been turned in for the [facility] card, colonoscopy complete, and connected to community resources for support . See Care Plan below for interventions and patient self-care actives.  Follow up Plan: Patient would like continued follow-up.  CCM LCSW will f/u with pateint within 90 days . Patient will call office if needed prior to next encounter.  Review of patient past medical history, allergies, medications, and health status, including review of relevant consultants reports was performed today as part of a comprehensive evaluation and provision of chronic care management and care coordination services.  SDOH (Social Determinants of Health) assessments and interventions performed:    Advanced Directives Status: Not addressed in this encounter.  Care Plan  No Known Allergies  No outpatient encounter medications on file as of 02/12/2020.   No facility-administered encounter medications on file as of 02/12/2020.    Patient Active Problem List   Diagnosis Date Noted  . Unintentional weight loss 12/24/2019    Conditions to be addressed/monitored: Financial constraints related to no income.  Care Plan : General Social Work (Adult)  Updates made by Maurine Cane, LCSW since 02/12/2020 12:00 AM  Problem: Needs additional community support   Long-Range Goal: Maintain and self management  barriers to treatment   Start  Date: 01/22/2020  Expected End Date: 06/08/2020  Recent Progress: On track  Priority: Medium   The patient has demonstrated independence in self care management of connecting to needs resources including adherence to treatment plan as established by the provider and care team and collaboratively established goals have been met. The patient wishes to remain enrolled in care coordination services and requests ongoing follow up for assistance as needed.  Current Barriers:  . Care coordination maintenance related to psychosocial needs . No insurance  . No income Case Manager Clinical Goal(s):  Marland Kitchen Over the next 3 months, patient will:  o maintain self management of social needs with support of family as evidenced by adherence to provider prescribed treatment plan and long term plan for self health maintenance.  Interventions:  . Confirmed that patient has contact information for care management team and appropriate providers and community agencies or resources.  . Collaboration with Lurline Del, DO regarding development and update of comprehensive plan of care as evidenced by provider attestation and co-signature . Inter-disciplinary care team collaboration (see longitudinal plan of care) . Assessment of needs, barriers , agencies contacted, as well as how impacting Patient Goals/Self Care Activities:  . Patient will attend all scheduled provider appointments . Patient will call provider office for new concerns or questions . Get back taxes completed  and f/u on [employer] card application   Plan: The care management team will reach out to the patient again by phone over the next 3 months for ongoing follow up of care coordination needs. The patient will reach out to the provider office or care management team for new issues/questions/concerns or to request assistance with healthcare  needs.      Casimer Lanius, [place] / [place]   [phone] 10:02 AM

## 2020-03-03 ENCOUNTER — Ambulatory Visit: Payer: BLUE CROSS/BLUE SHIELD | Admitting: Licensed Clinical Social Worker

## 2020-03-03 NOTE — Chronic Care Management (AMB) (Signed)
Care Management Clinical Social Work Note  03/03/2020 Name: Logan Calderon MRN: [id number] DOB: [birth year]  Logan Calderon is a 55 y.o. year old male who is a primary care patient of Lurline Del, DO.  The Care Management team was consulted for assistance with chronic disease management and coordination needs.  Engaged with patient by telephone. Returned phone call from voice message left with questions about his [employer].  Consent to Services:  Patient agreed to services and consent obtained.   Assessment: Patient is making progress and has been approved for the [facility], received a copy in the mail this week.  Was informed he needed to go to an office that takes the [employer]. Was referred to [facility] but wants to remain with [facility].   See Care Plan below for interventions and patient self-care actives. Recommendation: Patient may benefit from, and is in agreement to contact [facility] to see if he is able to bee seen there as they are [facility] facility and also take the orange card.  Follow up Plan: Patient does not require continued follow-up. Will contact the office if needed.  LCSW will stay connected to care team until patient transitions to new provider office.  Review of patient past medical history, allergies, medications, and health status, including review of relevant consultants reports was performed today as part of a comprehensive evaluation and provision of chronic care management and care coordination services.  SDOH (Social Determinants of Health) assessments and interventions performed:    Advanced Directives Status: Not addressed in this encounter.  Care Plan  No Known Allergies  No outpatient encounter medications on file as of 03/03/2020.   No facility-administered encounter medications on file as of 03/03/2020.    Patient Active Problem List   Diagnosis Date Noted  . Unintentional weight loss 12/24/2019    Conditions to be  addressed/monitored: ; Lacks knowledge of community resources:  Care Plan : General Social Work (Adult)  Updates made by Maurine Cane, LCSW since 03/03/2020 12:00 AM  Problem: Needs additional community support   Long-Range Goal: Maintain and self management  barriers to treatment   Start Date: 01/22/2020  Expected End Date: 06/08/2020  Recent Progress: On track  Priority: Medium   The patient has demonstrated independence in self care management of connecting to needs resources including adherence to treatment plan as established by the provider and care team and collaboratively established goals have been met. The patient wishes to remain enrolled in care coordination services and requests ongoing follow up for assistance as needed.  Current Barriers:  . Care coordination maintenance related to psychosocial needs . No insurance( has just received his [employer]) . No income  Case Manager Clinical Goal(s):  Marland Kitchen Over the next 3 months, patient will:  o maintain self management of social needs with support of family as evidenced by adherence to provider prescribed treatment plan and long term plan for self health maintenance.  Interventions:  . Confirmed that patient has contact information for care management team and appropriate providers and community agencies or resources.  . Collaboration with Lurline Del, DO regarding development and update of comprehensive plan of care as evidenced by provider attestation and co-signature . Inter-disciplinary care team collaboration (see longitudinal plan of care) . Assessment of needs, barriers , agencies contacted, as well as how impacting . Processed options with [employer]; Solution focused and task center interventions provided Patient Goals/Self Care Activities:  . Patient will attend all scheduled  provider appointments . Patient will call provider office for new concerns or questions . Get back taxes completed  and f/u on [employer] card  application  Plan: The care management team will reach out to the patient again by phone over the next 3 months for ongoing follow up of care coordination needs. The patient will reach out to the provider office or care management team for new issues/questions/concerns or to request assistance with healthcare needs.      Casimer Lanius, [place] / [place]   [phone] 9:42 AM

## 2020-03-03 NOTE — Patient Instructions (Signed)
  Logan Calderon  it was nice speaking with you. Please call me directly [phone] if you have questions about the goals we discussed. Goals Addressed            This Visit's Progress   . Find Help in My Community   On track    Timeframe:  Short-Term Goal Priority:  High Start Date: 01/05/2020                            Expected End Date:  06/08/2020                      Patient Goals/Self-Care Activities:  . Patient will attend all scheduled provider appointments . Patient will call provider office for new concerns or questions . Get back taxes completed . Contact community Health and Wellness to establish care Why is this important?    Knowing how and where to find help for yourself or family in your neighborhood and community is an important skill.      [name] received Care Management services today:  1. Care Management services include personalized support from designated clinical staff supervised by his physician, including individualized plan of care and coordination with other care providers 2. 24/7 contact [phone] for assistance for urgent and routine care needs. 3. Care Management are voluntary services and be declined at any time by calling the office.  Patient verbalizes understanding of instructions provided today.    Follow up plan: no follow up scheduled  [name], LCSW

## 2020-04-27 ENCOUNTER — Ambulatory Visit: Payer: BLUE CROSS/BLUE SHIELD | Admitting: Licensed Clinical Social Worker

## 2020-04-27 DIAGNOSIS — Z789 Other specified health status: Secondary | ICD-10-CM

## 2020-04-27 NOTE — Patient Instructions (Signed)
  Mr. Logan Calderon  it was nice speaking with you. Please call me directly if you have questions about the goals we discussed. Goals Addressed            This Visit's Progress   . Find Help in My Community       Timeframe:  Short-Term Goal Priority:  High Start Date: 01/05/2020                            Expected End Date:  06/08/2020                      Patient Goals/Self-Care Activities:  . Patient will attend new patient appointment at [facility]   . Patient will call provider office for new concerns or questions . Contact DSS for additional services and support  Why is this important?    Knowing how and where to find help for yourself or family in your neighborhood and community is an important skill.      Mr. Logan Calderon received Care Coordination services today:  1. Care Coordination services include personalized support from designated clinical staff supervised by his physician, including individualized plan of care and coordination with other care providers 2. 24/7 contact [phone] for assistance for urgent and routine care needs. 3. Care Coordination are voluntary services and be declined at any time by calling the office.  Patient verbalizes understanding of instructions provided today.    Follow up plan: No follow up schedule,  [name], LCSW  [phone]

## 2020-04-27 NOTE — Chronic Care Management (AMB) (Signed)
Care Management   Clinical Social Work Note  04/27/2020 Name: Logan Calderon MRN: [id number] DOB: [birth year]  Logan Calderon is a 55 y.o. year old male who is a primary care patient of Lurline Del, DO. The CCM team was consulted to assist the patient with chronic disease management and/or care coordination needs related to: [employer]  and insurance options.   Engaged with patient by telephone for follow up visit in response to provider referral for social work chronic care management and care coordination services.   Consent to Services:  The patient was given information about Chronic Care Management services, agreed to services, and gave verbal consent prior to initiation of services.  Please see initial visit note for detailed documentation.   Patient agreed to services and consent obtained.    Assessment: Patient is making progress with making process and has been able to connect to establish care with [facility]. . See Care Plan below for interventions and patient self-care actives.  Recent life changes [name]: Received one time financial support we discussed  Recommendation: Patient may benefit from, and is in agreement to apply for food stamps at [place].   Follow up Plan: Patient does not require or desire continued follow-up. LCSW will disconnect from care team since patient will be establishing care at [facility].    Review of patient past medical history, allergies, medications, and health status, including review of relevant consultants reports was performed today as part of a comprehensive evaluation and provision of chronic care management and care coordination services.     SDOH (Social Determinants of Health) assessments and interventions performed:    Advanced Directives Status: Not addressed in this encounter.  CCM Care Plan  No Known Allergies  No outpatient encounter medications on file as of 04/27/2020.   No  facility-administered encounter medications on file as of 04/27/2020.    Patient Active Problem List   Diagnosis Date Noted  . Unintentional weight loss 12/24/2019    Conditions to be addressed/monitored:   Care Plan : General Social Work (Adult)  Updates made by Maurine Cane, LCSW since 04/27/2020 12:00 AM    Problem: Needs additional community support     Long-Range Goal: Maintain and self management  barriers to treatment Completed 04/27/2020  Start Date: 01/22/2020  Expected End Date: 06/08/2020  This Visit's Progress: On track  Recent Progress: On track  Priority: Medium   The patient has demonstrated independence in self care management of connecting to needs resources including adherence to treatment plan as established by the provider and care team and collaboratively established goals have been met. The patient wishes to remain enrolled in care coordination services and requests ongoing follow up for assistance as needed.  Current Barriers:  . Care coordination maintenance related to psychosocial needs . No insurance( has just received his [employer]) . No income  Case Manager Clinical Goal(s):  Marland Kitchen Over the next 3 months, patient will:  o maintain self management of social needs with support of family as evidenced by adherence to provider prescribed treatment plan and long term plan for self health maintenance.  Interventions:  . Confirmed that patient has contact information for care management team and appropriate providers and community agencies or resources.  . Collaboration with Lurline Del, DO regarding development and update of comprehensive plan of care as evidenced by provider attestation and co-signature . Inter-disciplinary care team collaboration (see longitudinal plan of care) . Assessment of needs, barriers ,  agencies contacted, as well as how impacting . Processed options with [employer]; Solution focused and task center interventions provided Patient  Goals/Self Care Activities:  . Patient will attend all scheduled provider appointments . Patient will call provider office for new concerns or questions . Get back taxes completed  and f/u on Orange card application  Plan: The care management team will reach out to the patient again by phone over the next 3 months for ongoing follow up of care coordination needs. The patient will reach out to the provider office or care management team for new issues/questions/concerns or to request assistance with healthcare needs.       Casimer Lanius, [place] / [place]   [phone] 8:59 AM

## 2020-06-03 ENCOUNTER — Other Ambulatory Visit: Payer: Self-pay

## 2020-06-03 ENCOUNTER — Ambulatory Visit: Payer: Self-pay | Attending: Internal Medicine | Admitting: Internal Medicine

## 2020-06-03 ENCOUNTER — Encounter: Payer: Self-pay | Admitting: Internal Medicine

## 2020-06-03 VITALS — BP 126/79 | HR 110 | Resp 16 | Ht 76.5 in | Wt 196.8 lb

## 2020-06-03 DIAGNOSIS — R000 Tachycardia, unspecified: Secondary | ICD-10-CM | POA: Insufficient documentation

## 2020-06-03 DIAGNOSIS — Z114 Encounter for screening for human immunodeficiency virus [HIV]: Secondary | ICD-10-CM | POA: Insufficient documentation

## 2020-06-03 DIAGNOSIS — R460 Very low level of personal hygiene: Secondary | ICD-10-CM | POA: Insufficient documentation

## 2020-06-03 DIAGNOSIS — E119 Type 2 diabetes mellitus without complications: Secondary | ICD-10-CM | POA: Insufficient documentation

## 2020-06-03 DIAGNOSIS — R634 Abnormal weight loss: Secondary | ICD-10-CM | POA: Insufficient documentation

## 2020-06-03 DIAGNOSIS — Z560 Unemployment, unspecified: Secondary | ICD-10-CM | POA: Insufficient documentation

## 2020-06-03 DIAGNOSIS — Z1159 Encounter for screening for other viral diseases: Secondary | ICD-10-CM | POA: Insufficient documentation

## 2020-06-03 DIAGNOSIS — Z23 Encounter for immunization: Secondary | ICD-10-CM | POA: Insufficient documentation

## 2020-06-03 DIAGNOSIS — Z7689 Persons encountering health services in other specified circumstances: Secondary | ICD-10-CM

## 2020-06-03 DIAGNOSIS — L602 Onychogryphosis: Secondary | ICD-10-CM

## 2020-06-03 LAB — GLUCOSE, POCT (MANUAL RESULT ENTRY): POC Glucose: 140 mg/dl — AB (ref 70–99)

## 2020-06-03 LAB — POCT GLYCOSYLATED HEMOGLOBIN (HGB A1C): HbA1c, POC (controlled diabetic range): 6.5 % (ref 0.0–7.0)

## 2020-06-03 MED ORDER — TRUE METRIX METER W/DEVICE KIT
PACK | 0 refills | Status: AC
  Filled 2020-06-03: qty 1, 1d supply, fill #0

## 2020-06-03 MED ORDER — TRUE METRIX BLOOD GLUCOSE TEST VI STRP
ORAL_STRIP | 12 refills | Status: AC
  Filled 2020-06-03: qty 100, 25d supply, fill #0

## 2020-06-03 MED ORDER — TRUEPLUS LANCETS 28G MISC
4 refills | Status: AC
  Filled 2020-06-03: qty 100, 30d supply, fill #0

## 2020-06-03 NOTE — Progress Notes (Signed)
Pt states he had a colonoscopy a couple months ago    [id number] a1c-6.5

## 2020-06-03 NOTE — Progress Notes (Signed)
Patient ID: Logan Calderon, male    DOB: 07-Aug-1965  MRN: [id number]  CC: New Patient (Initial Visit)   Subjective: Logan Calderon is a 55 y.o. male who presents for est care. His concerns today include:   Patient denies any chronic medical issues.  He was seen at family medicine residency clinic with main concern being unexplained weight loss.  Patient felt the weight loss was due to the fact that he was eating less fast food during the pandemic.  Review of systems was negative.  No major work-up done as patient was uninsured and did not want to acquire a large bill.  He was told to apply for the orange card/cone discount.  CBC was normal.  Chemistry revealed blood sugar of 224.  Since then, patient has gained 15 pounds.  He attributes this to eating more.  He went back to eating fast food several days a week.  He lives alone.  He does not cook much.  He does some frozen meals.  Eats 2 meals a day, breakfast and dinner.  He snacks on chips in between.  He drinks juices, milk, diet sodas and water.  Denies any alcohol, street drugs or tobacco use.  No polyuria or polydipsia.  No numbness in the hands or feet.  Only thing that he has notices that sometimes the thigh muscle feels sore when he walks up steps.  No pain in the thigh or calf muscle when walking on flat surfaces. -not very active.  Currently unemployed but has an interview next week with [employer]. -Noted to have mild tachycardia on his vitals today.  He denies any feelings of palpitations.  He feels he does not drink adequate fluids during the day.  Denies feeling hot all the time.  Denies diarrhea.  Denies street drug use.  Past medical, surgical, family and social history reviewed. Patient Active Problem List   Diagnosis Date Noted  . Unintentional weight loss 12/24/2019     No current outpatient medications on file prior to visit.   No current facility-administered medications on file prior to visit.    No  Known Allergies  Social History   Socioeconomic History  . Marital status: Single    Spouse name: n/a  . Number of children: 0  . Years of education: 12th grade  . Highest education level: Not on file  Occupational History  . Occupation: unemployed  Tobacco Use  . Smoking status: Never Smoker  . Smokeless tobacco: Never Used  Substance and Sexual Activity  . Alcohol use: No    Alcohol/week: 0.0 standard drinks  . Drug use: No  . Sexual activity: Not on file  Other Topics Concern  . Not on file  Social History Narrative   Lives alone. Parents live in [place], [place]   Social Determinants of Health   Financial Resource Strain: Not on file  Food Insecurity: No Food Insecurity  . Worried About Charity fundraiser in the Last Year: Never true  . Ran Out of Food in the Last Year: Never true  Transportation Needs: No Transportation Needs  . Lack of Transportation (Medical): No  . Lack of Transportation (Non-Medical): No  Physical Activity: Not on file  Stress: Not on file  Social Connections: Not on file  Intimate Partner Violence: Not on file    Family History  Problem Relation Age of Onset  . Diabetes Sister   . Kidney failure Sister   . Diabetes Mother  Past Surgical History:  Procedure Laterality Date  . CATARACT EXTRACTION, BILATERAL  2021    ROS: Review of Systems  Constitutional: Negative for activity change and fatigue.  HENT: Negative for congestion, hearing loss and sore throat.   Eyes: Negative for visual disturbance.       Had cataract extraction BL last year  Respiratory: Negative for cough and shortness of breath.   Cardiovascular: Negative for chest pain, palpitations and leg swelling.  Gastrointestinal: Negative for blood in stool.       Had c-scope earlier this yr in [place].  6-7 polyps removed.  Told will need repeat in 2-3 yrs.  Endocrine: Negative for cold intolerance, heat intolerance and polyuria.  Genitourinary: Negative for difficulty  urinating, dysuria and hematuria.  Skin: Negative for rash.  Neurological: Negative for dizziness, tremors and light-headedness.  Hematological: Does not bruise/bleed easily.  Psychiatric/Behavioral: Negative for dysphoric mood. The patient is not nervous/anxious.      PHYSICAL EXAM: BP 126/79   Pulse (!) 110   Resp 16   Ht 6' 4.5" (1.943 m)   Wt 196 lb 12.8 oz (89.3 kg)   SpO2 100%   BMI 23.64 kg/m   Wt Readings from Last 3 Encounters:  06/03/20 196 lb 12.8 oz (89.3 kg)  12/24/19 181 lb 12.8 oz (82.5 kg)  07/28/14 (!) 311 lb (141.1 kg)  BP sitting 121/80, pulse 119 BP standing 110/76, pulse 139  Physical Exam General appearance - alert, well appearing, middle-aged older African-American male and in no distress Mental status - normal mood, behavior, speech, dress, motor activity, and thought processes Eyes - pupils equal and reactive, extraocular eye movements intact Nose - normal and patent, no erythema, discharge or polyps Mouth -oral mucosa slightly dry.  No oral lesions.  Throat is clear. Neck - supple, no significant adenopathy Lymphatics - no palpable lymphadenopathy, no hepatosplenomegaly Chest - clear to auscultation, no wheezes, rales or rhonchi, symmetric air entry Heart -mild tachycardia , regular rhythm, normal S1, S2, no murmurs, rubs, clicks or gallops Neurological - cranial nerves II through XII intact, motor and sensory grossly normal bilaterally.  Gait is normal. Musculoskeletal good range of motion of joints in the upper and lower extremities. Extremities -no lower extremity edema.  Dorsalis pedis, posterior tibialis, popliteal and femoral pulses are 3+ bilaterally.  Legs are warm.  No cyanosis noted in the feet. Diabetic Foot Exam - Simple   Simple Foot Form Visual Inspection See comments: Yes Sensation Testing Intact to touch and monofilament testing bilaterally: Yes Pulse Check Posterior Tibialis and Dorsalis pulse intact bilaterally:  Yes Comments Patient is flat-footed.  Some dead skin buildup on the dorsal surface of the toes.  Toenails are thick, discolored and mildly overgrown.    Results for orders placed or performed in visit on 06/03/20  POCT glucose (manual entry)  Result Value Ref Range   POC Glucose 140 (A) 70 - 99 mg/dl  POCT glycosylated hemoglobin (Hb A1C)  Result Value Ref Range   Hemoglobin A1C     HbA1c POC (<> result, manual entry)     HbA1c, POC (prediabetic range)     HbA1c, POC (controlled diabetic range) 6.5 0.0 - 7.0 %   EKG sinus tachycardia at 117 bpm.  CMP Latest Ref Rng & Units 12/24/2019 11/30/2012 09/08/2012  Glucose 65 - 99 mg/dL 224(H) 135(H) 123(H)  BUN 6 - 24 mg/dL _0 Creatinine 0.76 - 1.27 mg/dL 1.07 1.27 1.19  Sodium 134 - 144 mmol/L 141  139 138  Potassium 3.5 - 5.2 mmol/L 4.0 3.6 3.8  Chloride 96 - 106 mmol/L 102 103 104  CO2 20 - 29 mmol/L _0 Calcium 8.7 - 10.2 mg/dL 9.3 8.9 8.6  Total Protein 6.0 - 8.5 g/dL 6.4 7.3 6.8  Total Bilirubin 0.0 - 1.2 mg/dL 0.5 0.3 0.3  Alkaline Phos 44 - 121 IU/L 63 90 71  AST 0 - 40 IU/L _1 ALT 0 - 44 IU/L _2 Lipid Panel  No results found for: CHOL, TRIG, HDL, CHOLHDL, VLDL, LDLCALC, LDLDIRECT  CBC    Component Value Date/Time   WBC 4.1 12/24/2019 1111   WBC 8.0 11/30/2012 1646   RBC 4.50 12/24/2019 1111   RBC 4.74 11/30/2012 1646   HGB 14.3 12/24/2019 1111   HCT 41.7 12/24/2019 1111   PLT 246 12/24/2019 1111   MCV 93 12/24/2019 1111   MCH 31.8 12/24/2019 1111   MCH 31.4 11/30/2012 1646   MCHC 34.3 12/24/2019 1111   MCHC 34.3 11/30/2012 1646   RDW 12.7 12/24/2019 1111   LYMPHSABS 1.6 12/24/2019 1111   MONOABS 0.3 09/08/2012 1908   EOSABS 0.0 12/24/2019 1111   BASOSABS 0.0 12/24/2019 1111    ASSESSMENT AND PLAN: 1. Encounter to establish care  2. New onset type 2 diabetes mellitus ([place]) Discussed diagnosis of diabetes with him and standard of care for diabetics.  Blood sugar and A1c today done  fasting as patient had not eaten as yet for the morning. Discussed the importance of healthy eating habits, regular aerobic exercise (at least 150 minutes a week as tolerated) and medication compliance to achieve or maintain control of diabetes.  At this time I will hold off on starting medications.  Encouraged him to be more active. -Went over blood sugar goals before meals being 90-130.  Prescription sent for diabetic testing supplies.  Encouraged him to check blood sugars several times a week if not daily before breakfast. -Referral for nutrition counseling. -Encouraged to get yearly eye exams. - CBC - Comprehensive metabolic panel - Lipid panel - Amb ref to Medical Nutrition Therapy-MNT - glucose blood (TRUE METRIX BLOOD GLUCOSE TEST) test strip; Use as instructed  Dispense: 100 each; Refill: 12 - Blood Glucose Monitoring Suppl (TRUE METRIX METER) w/Device KIT; Use as directed  Dispense: 1 kit; Refill: 0 - TRUEplus Lancets 28G MISC; Use as directed  Dispense: 100 each; Refill: 4 - POCT glucose (manual entry) - POCT glycosylated hemoglobin (Hb A1C) - Microalbumin / creatinine urine ratio  3. Tachycardia He looks a little dry.  Encouraged him to drink several glasses of water daily.  Large cup of water given today before he left. - TSH+T4F+T3Free - EKG 12-Lead  4. Need for hepatitis C screening test Patient agreeable to screening. - Hepatitis C Antibody  5. Screening for HIV (human immunodeficiency virus) Patient agreeable for screening. - HIV antibody (with reflex)  6. 23-polyvalent pneumococcal polysaccharide vaccine  Given  7. Overgrown toenails Good feet hygiene discussed and encouraged. - Ambulatory referral to Podiatry     Patient was given the opportunity to ask questions.  Patient verbalized understanding of the plan and was able to repeat key elements of the plan.   Orders Placed This Encounter  Procedures  . CBC  . Comprehensive metabolic panel  . Lipid panel   . TSH+T4F+T3Free  . Hepatitis C Antibody  . HIV antibody (with reflex)  . Microalbumin / creatinine urine ratio  . Amb ref  to Medical Nutrition Therapy-MNT  . Ambulatory referral to Podiatry  . POCT glucose (manual entry)  . POCT glycosylated hemoglobin (Hb A1C)  . EKG 12-Lead     Requested Prescriptions   Signed Prescriptions Disp Refills  . glucose blood (TRUE METRIX BLOOD GLUCOSE TEST) test strip 100 each 12    Sig: Use as instructed  . Blood Glucose Monitoring Suppl (TRUE METRIX METER) w/Device KIT 1 kit 0    Sig: Use as directed  . TRUEplus Lancets 28G MISC 100 each 4    Sig: Use as directed    Return in about 2 months (around 08/03/2020).  Karle Plumber, MD, FACP

## 2020-06-03 NOTE — Patient Instructions (Addendum)
Check your blood sugars at least 3 to 4 days a week before breakfast.  The goal for blood sugars before meals is 90-130.  Try to get an eye exam done before the end of the year.  It is recommended that people with diabetes get an eye exam done once a year. Diabetes Mellitus and Standards of Medical Care Living with and managing diabetes (diabetes mellitus) can be complicated. Your diabetes treatment may be managed by a team of health care providers, including:  A physician who specializes in diabetes (endocrinologist). You might also have visits with a nurse practitioner or physician assistant.  Nurses.  A registered dietitian.  A certified diabetes care and education specialist.  An exercise specialist.  A pharmacist.  An eye doctor.  A foot specialist (podiatrist).  A dental care provider.  A primary care provider.  A mental health care provider. How to manage your diabetes You can do many things to successfully manage your diabetes. Your health care providers will follow guidelines to help you get the best quality of care. Here are general guidelines for your diabetes management plan. Your health care providers may give you more specific instructions. Physical exams When you are diagnosed with diabetes, and each year after that, your health care provider will ask about your medical and family history. You will have a physical exam, which may include:  Measuring your height, weight, and body mass index (BMI).  Checking your blood pressure. This will be done at every routine medical visit. Your target blood pressure may vary depending on your medical conditions, your age, and other factors.  A thyroid exam.  A skin exam.  Screening for nerve damage (peripheral neuropathy). This may include checking the pulse in your legs and feet and the level of sensation in your hands and feet.  A foot exam to inspect the structure and skin of your feet, including checking for cuts,  bruises, redness, blisters, sores, or other problems.  Screening for blood vessel (vascular) problems. This may include checking the pulse in your legs and feet and checking your temperature. Blood tests Depending on your treatment plan and your personal needs, you may have the following tests:  Hemoglobin A1C (HbA1C). This test provides information about blood sugar (glucose) control over the previous 2-3 months. It is used to adjust your treatment plan, if needed. This test will be done: ? At least 2 times a year, if you are meeting your treatment goals. ? 4 times a year, if you are not meeting your treatment goals or if your goals have changed.  Lipid testing, including total cholesterol, LDL and HDL cholesterol, and triglyceride levels. ? The goal for LDL is less than 100 mg/dL (5.5 mmol/L). If you are at high risk for complications, the goal is less than 70 mg/dL (3.9 mmol/L). ? The goal for HDL is 40 mg/dL (2.2 mmol/L) or higher for men, and 50 mg/dL (2.8 mmol/L) or higher for women. An HDL cholesterol of 60 mg/dL (3.3 mmol/L) or higher gives some protection against heart disease. ? The goal for triglycerides is less than 150 mg/dL (8.3 mmol/L).  Liver function tests.  Kidney function tests.  Thyroid function tests.   Dental and eye exams  Visit your dentist two times a year.  If you have type 1 diabetes, your health care provider may recommend an eye exam within 5 years after you are diagnosed, and then once a year after your first exam. ? For children with type 1 diabetes, the  health care provider may recommend an eye exam when your child is age 53 or older and has had diabetes for 3-5 years. After the first exam, your child should get an eye exam once a year.  If you have type 2 diabetes, your health care provider may recommend an eye exam as soon as you are diagnosed, and then every 1-2 years after your first exam.   Immunizations  A yearly flu (influenza) vaccine is  recommended annually for everyone 6 months or older. This is especially important if you have diabetes.  The pneumonia (pneumococcal) vaccine is recommended for everyone 2 years or older who has diabetes. If you are age 53 or older, you may get the pneumonia vaccine as a series of two separate shots.  The hepatitis B vaccine is recommended for adults shortly after being diagnosed with diabetes. Adults and children with diabetes should receive all other vaccines according to age-specific recommendations from the Centers for Disease Control and Prevention (CDC). Mental and emotional health Screening for symptoms of eating disorders, anxiety, and depression is recommended at the time of diagnosis and after as needed. If your screening shows that you have symptoms, you may need more evaluation. You may work with a mental health care provider. Follow these instructions at home: Treatment plan You will monitor your blood glucose levels and may give yourself insulin. Your treatment plan will be reviewed at every medical visit. You and your health care provider will discuss:  How you are taking your medicines, including insulin.  Any side effects you have.  Your blood glucose level target goals.  How often you monitor your blood glucose level.  Lifestyle habits, such as activity level and tobacco, alcohol, and substance use. Education Your health care provider will assess how well you are monitoring your blood glucose levels and whether you are taking your insulin and medicines correctly. He or she may refer you to:  A certified diabetes care and education specialist to manage your diabetes throughout your life, starting at diagnosis.  A registered dietitian who can create and review your personal nutrition plan.  An exercise specialist who can discuss your activity level and exercise plan. General instructions  Take over-the-counter and prescription medicines only as told by your health care  provider.  Keep all follow-up visits. This is important. Where to find support There are many diabetes support networks, including:  American Diabetes Association (ADA): diabetes.org  Defeat Diabetes Foundation: defeatdiabetes.org Where to find more information  American Diabetes Association (ADA): www.diabetes.org  Association of Diabetes Care & Education Specialists (ADCES): diabeteseducator.org  International Diabetes Federation (IDF): http://hill.biz/ Summary  Managing diabetes (diabetes mellitus) can be complicated. Your diabetes treatment may be managed by a team of health care providers.  Your health care providers follow guidelines to help you get the best quality care.  You should have physical exams, blood tests, blood pressure monitoring, immunizations, and screening tests regularly. Stay updated on how to manage your diabetes.  Your health care providers may also give you more specific instructions based on your individual health. This information is not intended to replace advice given to you by your health care provider. Make sure you discuss any questions you have with your health care provider. Document Revised: 07/03/2019 Document Reviewed: 07/03/2019 Elsevier Patient Education  2021 Elsevier Inc.   Pneumococcal Polysaccharide Vaccine (PPSV23): What You Need to Know 1. Why get vaccinated? Pneumococcal polysaccharide vaccine (PPSV23) can prevent pneumococcal disease. Pneumococcal disease refers to any illness caused by pneumococcal bacteria.  These bacteria can cause many types of illnesses, including pneumonia, which is an infection of the lungs. Pneumococcal bacteria are one of the most common causes of pneumonia. Besides pneumonia, pneumococcal bacteria can also cause:  Ear infections  Sinus infections  Meningitis (infection of the tissue covering the brain and spinal cord)  Bacteremia (bloodstream infection) Anyone can get pneumococcal disease, but children under  47 years of age, people with certain medical conditions, adults 65 years or older, and cigarette smokers are at the highest risk. Most pneumococcal infections are mild. However, some can result in long-term problems, such as brain damage or hearing loss. Meningitis, bacteremia, and pneumonia caused by pneumococcal disease can be fatal. 2. PPSV23 PPSV23 protects against 23 types of bacteria that cause pneumococcal disease. PPSV23 is recommended for:  All adults 65 years or older,  Anyone 2 years or older with certain medical conditions that can lead to an increased risk for pneumococcal disease. Most people need only one dose of PPSV23. A second dose of PPSV23, and another type of pneumococcal vaccine called PCV13, are recommended for certain high-risk groups. Your health care provider can give you more information. People 65 years or older should get a dose of PPSV23 even if they have already gotten one or more doses of the vaccine before they turned 25. 3. Talk with your health care provider Tell your vaccine provider if the person getting the vaccine:  Has had an allergic reaction after a previous dose of PPSV23, or has any severe, life-threatening allergies. In some cases, your health care provider may decide to postpone PPSV23 vaccination to a future visit. People with minor illnesses, such as a cold, may be vaccinated. People who are moderately or severely ill should usually wait until they recover before getting PPSV23. Your health care provider can give you more information. 4. Risks of a vaccine reaction  Redness or pain where the shot is given, feeling tired, fever, or muscle aches can happen after PPSV23. People sometimes faint after medical procedures, including vaccination. Tell your provider if you feel dizzy or have vision changes or ringing in the ears. As with any medicine, there is a very remote chance of a vaccine causing a severe allergic reaction, other serious injury, or  death. 5. What if there is a serious problem? An allergic reaction could occur after the vaccinated person leaves the clinic. If you see signs of a severe allergic reaction (hives, swelling of the face and throat, difficulty breathing, a fast heartbeat, dizziness, or weakness), call 9-1-1 and get the person to the nearest hospital. For other signs that concern you, call your health care provider. Adverse reactions should be reported to the Vaccine Adverse Event Reporting System (VAERS). Your health care provider will usually file this report, or you can do it yourself. Visit the VAERS website at www.vaers.LAgents.no or call (979)255-8961. VAERS is only for reporting reactions, and VAERS staff do not give medical advice. 6. How can I learn more?  Ask your health care provider.  Call your local or state health department.  Contact the Centers for Disease Control and Prevention (CDC): ? Call 949-637-3834 (1-800-CDC-INFO) or ? Visit CDC's website at PicCapture.uy Vaccine Information Statement PPSV23 Vaccine (11/07/2017) This information is not intended to replace advice given to you by your health care provider. Make sure you discuss any questions you have with your health care provider. Document Revised: 08/29/2019 Document Reviewed: 08/29/2019 Elsevier Patient Education  2021 [employer].

## 2020-06-04 ENCOUNTER — Telehealth: Payer: Self-pay

## 2020-06-04 LAB — COMPREHENSIVE METABOLIC PANEL
ALT: 11 IU/L (ref 0–44)
AST: 16 IU/L (ref 0–40)
Albumin/Globulin Ratio: 1.7 (ref 1.2–2.2)
Albumin: 4.5 g/dL (ref 3.8–4.9)
Alkaline Phosphatase: 83 IU/L (ref 44–121)
BUN/Creatinine Ratio: 18 (ref 9–20)
BUN: 22 mg/dL (ref 6–24)
Bilirubin Total: 0.4 mg/dL (ref 0.0–1.2)
CO2: 23 mmol/L (ref 20–29)
Calcium: 9.3 mg/dL (ref 8.7–10.2)
Chloride: 103 mmol/L (ref 96–106)
Creatinine, Ser: 1.21 mg/dL (ref 0.76–1.27)
Globulin, Total: 2.6 g/dL (ref 1.5–4.5)
Glucose: 147 mg/dL — ABNORMAL HIGH (ref 65–99)
Potassium: 4.6 mmol/L (ref 3.5–5.2)
Sodium: 141 mmol/L (ref 134–144)
Total Protein: 7.1 g/dL (ref 6.0–8.5)
eGFR: 72 mL/min/{1.73_m2} (ref >59)

## 2020-06-04 LAB — MICROALBUMIN / CREATININE URINE RATIO
Creatinine, Urine: 240.5 mg/dL
Microalb/Creat Ratio: 27 mg/g creat (ref 0–29)
Microalbumin, Urine: 64.9 ug/mL

## 2020-06-04 LAB — CBC
Hematocrit: 41.2 % (ref 37.5–51.0)
Hemoglobin: 14 g/dL (ref 13.0–17.7)
MCH: 31.5 pg (ref 26.6–33.0)
MCHC: 34 g/dL (ref 31.5–35.7)
MCV: 93 fL (ref 79–97)
Platelets: 210 10*3/uL (ref 150–450)
RBC: 4.44 x10E6/uL (ref 4.14–5.80)
RDW: 12.5 % (ref 11.6–15.4)
WBC: 3.3 10*3/uL — ABNORMAL LOW (ref 3.4–10.8)

## 2020-06-04 LAB — LIPID PANEL
Chol/HDL Ratio: 2.1 ratio (ref 0.0–5.0)
Cholesterol, Total: 149 mg/dL (ref 100–199)
HDL: 71 mg/dL (ref >39)
LDL Chol Calc (NIH): 63 mg/dL (ref 0–99)
Triglycerides: 76 mg/dL (ref 0–149)
VLDL Cholesterol Cal: 15 mg/dL (ref 5–40)

## 2020-06-04 LAB — TSH+T4F+T3FREE
Free T4: 1.5 ng/dL (ref 0.82–1.77)
T3, Free: 3 pg/mL (ref 2.0–4.4)
TSH: 2.82 u[IU]/mL (ref 0.450–4.500)

## 2020-06-04 LAB — HEPATITIS C ANTIBODY: Hep C Virus Ab: <0.1 s/co ratio (ref 0.0–0.9)

## 2020-06-04 LAB — HIV ANTIBODY (ROUTINE TESTING W REFLEX): HIV Screen 4th Generation wRfx: NONREACTIVE

## 2020-06-04 NOTE — Telephone Encounter (Signed)
Contacted pt to go over lab results pt didn't answer lvm  

## 2020-06-07 ENCOUNTER — Encounter: Payer: Self-pay | Admitting: Internal Medicine

## 2020-06-07 NOTE — Progress Notes (Addendum)
I received colonoscopy report from Dr. Kellie Moor of [facility] gastroenterology [facility].  Colonoscopy done 01/21/2020. Patient had 3 sessile polyps removed from the transverse colon.  The polyps were medium in size.  A 12 mm sessile polyp was removed from the proximal ascending colon. Pathology report was not included with this.  I will have our front desk personnel petition [facility] gastroenterology for the pathology report.  Addendum: 06/16/2020.  I received the pathology report from his colonoscopy.  Findings were tubular adenoma multiple fragments.

## 2020-06-08 ENCOUNTER — Ambulatory Visit: Payer: Self-pay | Admitting: Licensed Clinical Social Worker

## 2020-06-08 NOTE — Chronic Care Management (AMB) (Signed)
   06/08/2020  Logan Calderon 1965/03/19 [id number]   LCSW returned phone call from patient.  He has questions and needed direction reference [employer] and [employer] assistance.  LCSW previously worked with patient when he received care at [facility] office.   Patient will consult with [facility] for ongoing needs. LCSW unable to provider ongoing support or interventions.  Logan Hines, LCSW Care Management & Coordination  [facility]   [phone] 12:00 PM

## 2020-06-08 NOTE — Progress Notes (Signed)
Called office and LVM with medical records management advising I was calling from [facility] regarding patient medical records. Advised that we were wanting a pathology report in addition to the colonoscopy report and inquired if we needed additional documentation to receive the pathology report. Advised to call back [phone] with questions or concerns or if pathology report could be faxed it can be faxed to [phone].   If office calls back and no additional documentation is needed please let office know to fax pathology report to our office- [phone] or if additional documentation is needed,  please just note documentation needed and we will reach out to patient. Thanks!

## 2020-06-09 ENCOUNTER — Telehealth: Payer: Self-pay | Admitting: Internal Medicine

## 2020-06-09 NOTE — Telephone Encounter (Signed)
Copied from CRM [id number]. Topic: General - Other >> Jun 08, 2020  4:52 PM Marylen Ponto wrote: Reason for CRM: Lawson Fiscal with [employer] called to see if the fax was received. Lawson Fiscal stated she received a conformation but she asked that her call be returned if the fax was not received. Cb# [phone]   Did you receive pathology report in fax yesterday? Please see documentation from 06/07/2020 regarding request. If not received I will contact gastro office again.

## 2020-06-09 NOTE — Telephone Encounter (Signed)
Pathology report has been received

## 2020-06-10 ENCOUNTER — Encounter (INDEPENDENT_AMBULATORY_CARE_PROVIDER_SITE_OTHER): Payer: Self-pay

## 2020-06-10 ENCOUNTER — Other Ambulatory Visit: Payer: Self-pay

## 2020-06-16 ENCOUNTER — Ambulatory Visit: Payer: Self-pay | Attending: Internal Medicine

## 2020-06-16 ENCOUNTER — Other Ambulatory Visit: Payer: Self-pay

## 2020-06-22 ENCOUNTER — Other Ambulatory Visit: Payer: Self-pay

## 2020-06-22 ENCOUNTER — Ambulatory Visit: Payer: Self-pay | Attending: Internal Medicine

## 2020-07-13 ENCOUNTER — Ambulatory Visit: Payer: Self-pay | Admitting: Dietician

## 2020-08-05 ENCOUNTER — Telehealth: Payer: Self-pay | Admitting: Internal Medicine

## 2020-08-05 NOTE — Telephone Encounter (Signed)
Called to tell pt provider will be out of office Monday and that the appt is now virtual and to expect a call no need to come into office

## 2020-08-09 ENCOUNTER — Other Ambulatory Visit: Payer: Self-pay

## 2020-08-09 ENCOUNTER — Ambulatory Visit: Payer: Self-pay | Attending: Internal Medicine | Admitting: Internal Medicine

## 2020-08-09 DIAGNOSIS — Z5329 Procedure and treatment not carried out because of patient's decision for other reasons: Secondary | ICD-10-CM

## 2020-08-09 DIAGNOSIS — Z91199 Patient's noncompliance with other medical treatment and regimen due to unspecified reason: Secondary | ICD-10-CM

## 2020-08-09 NOTE — Progress Notes (Signed)
No show for telephone visit. Left VMM to call back to reschedule.

## 2020-09-04 ENCOUNTER — Other Ambulatory Visit (HOSPITAL_COMMUNITY): Payer: Self-pay

## 2020-10-06 ENCOUNTER — Other Ambulatory Visit (HOSPITAL_COMMUNITY): Payer: Self-pay

## 2021-01-05 ENCOUNTER — Other Ambulatory Visit (HOSPITAL_COMMUNITY): Payer: Self-pay

## 2021-10-21 ENCOUNTER — Other Ambulatory Visit (HOSPITAL_COMMUNITY): Payer: Self-pay

## 2021-12-02 ENCOUNTER — Other Ambulatory Visit (HOSPITAL_COMMUNITY): Payer: Self-pay

## 2022-10-11 ENCOUNTER — Other Ambulatory Visit (HOSPITAL_COMMUNITY): Payer: Self-pay
